# Patient Record
Sex: Male | Born: 1985 | Race: White | Hispanic: No | Marital: Single | Smoking: Current every day smoker
Health system: Southern US, Community
[De-identification: ages and names within clinical notes are randomized; demographics above are authoritative.]

## PROBLEM LIST (undated history)

## (undated) DIAGNOSIS — K5090 Crohn's disease, unspecified, without complications: Secondary | ICD-10-CM

## (undated) DIAGNOSIS — N200 Calculus of kidney: Secondary | ICD-10-CM

## (undated) HISTORY — PX: OTHER SURGICAL HISTORY: SHX169

## (undated) HISTORY — PX: CHOLECYSTECTOMY: SHX55

---

## 2005-11-05 ENCOUNTER — Emergency Department (HOSPITAL_COMMUNITY): Admission: EM | Admit: 2005-11-05 | Discharge: 2005-11-06 | Payer: Self-pay | Admitting: Emergency Medicine

## 2005-11-07 ENCOUNTER — Inpatient Hospital Stay (HOSPITAL_COMMUNITY): Admission: EM | Admit: 2005-11-07 | Discharge: 2005-11-13 | Payer: Self-pay | Admitting: Emergency Medicine

## 2005-11-08 ENCOUNTER — Ambulatory Visit: Payer: Self-pay | Admitting: Internal Medicine

## 2005-11-11 ENCOUNTER — Encounter (INDEPENDENT_AMBULATORY_CARE_PROVIDER_SITE_OTHER): Payer: Self-pay | Admitting: Specialist

## 2005-11-11 ENCOUNTER — Ambulatory Visit: Payer: Self-pay | Admitting: Internal Medicine

## 2006-04-15 ENCOUNTER — Ambulatory Visit: Payer: Self-pay | Admitting: Internal Medicine

## 2006-04-16 ENCOUNTER — Ambulatory Visit: Payer: Self-pay | Admitting: Internal Medicine

## 2006-04-30 ENCOUNTER — Encounter (HOSPITAL_COMMUNITY): Admission: RE | Admit: 2006-04-30 | Discharge: 2006-07-29 | Payer: Self-pay | Admitting: Internal Medicine

## 2006-05-11 ENCOUNTER — Ambulatory Visit: Payer: Self-pay | Admitting: Internal Medicine

## 2006-05-25 ENCOUNTER — Ambulatory Visit: Payer: Self-pay | Admitting: Internal Medicine

## 2006-06-01 ENCOUNTER — Ambulatory Visit: Payer: Self-pay | Admitting: Internal Medicine

## 2006-06-02 ENCOUNTER — Ambulatory Visit: Payer: Self-pay | Admitting: Internal Medicine

## 2006-06-22 ENCOUNTER — Ambulatory Visit: Payer: Self-pay | Admitting: Internal Medicine

## 2006-06-30 ENCOUNTER — Ambulatory Visit: Payer: Self-pay | Admitting: Internal Medicine

## 2006-12-23 ENCOUNTER — Encounter: Payer: Self-pay | Admitting: Internal Medicine

## 2007-01-28 ENCOUNTER — Ambulatory Visit: Payer: Self-pay | Admitting: Internal Medicine

## 2007-07-26 ENCOUNTER — Encounter: Payer: Self-pay | Admitting: Internal Medicine

## 2007-09-30 ENCOUNTER — Encounter: Payer: Self-pay | Admitting: Internal Medicine

## 2008-01-19 ENCOUNTER — Encounter: Payer: Self-pay | Admitting: Internal Medicine

## 2008-01-21 ENCOUNTER — Encounter: Payer: Self-pay | Admitting: Internal Medicine

## 2008-03-05 ENCOUNTER — Encounter: Payer: Self-pay | Admitting: Internal Medicine

## 2008-04-13 DIAGNOSIS — F32A Depression, unspecified: Secondary | ICD-10-CM | POA: Insufficient documentation

## 2008-04-13 DIAGNOSIS — F411 Generalized anxiety disorder: Secondary | ICD-10-CM | POA: Insufficient documentation

## 2008-04-13 DIAGNOSIS — K219 Gastro-esophageal reflux disease without esophagitis: Secondary | ICD-10-CM

## 2008-04-13 DIAGNOSIS — F329 Major depressive disorder, single episode, unspecified: Secondary | ICD-10-CM

## 2008-04-13 DIAGNOSIS — E669 Obesity, unspecified: Secondary | ICD-10-CM | POA: Insufficient documentation

## 2008-04-14 ENCOUNTER — Ambulatory Visit: Payer: Self-pay | Admitting: Internal Medicine

## 2008-04-17 LAB — CONVERTED CEMR LAB
ALT: 38 units/L (ref 0–53)
AST: 24 units/L (ref 0–37)
Albumin: 4 g/dL (ref 3.5–5.2)
Basophils Absolute: 0 10*3/uL (ref 0.0–0.1)
Calcium: 9.5 mg/dL (ref 8.4–10.5)
Chloride: 102 meq/L (ref 96–112)
Eosinophils Absolute: 0.5 10*3/uL (ref 0.0–0.7)
MCHC: 35.3 g/dL (ref 30.0–36.0)
MCV: 86.9 fL (ref 78.0–100.0)
Neutrophils Relative %: 68.8 % (ref 43.0–77.0)
Platelets: 275 10*3/uL (ref 150–400)
Potassium: 3.6 meq/L (ref 3.5–5.1)
RDW: 12.6 % (ref 11.5–14.6)

## 2008-04-19 ENCOUNTER — Encounter (HOSPITAL_COMMUNITY): Admission: RE | Admit: 2008-04-19 | Discharge: 2008-07-18 | Payer: Self-pay | Admitting: Internal Medicine

## 2008-04-20 ENCOUNTER — Telehealth: Payer: Self-pay | Admitting: Internal Medicine

## 2008-04-20 ENCOUNTER — Encounter: Payer: Self-pay | Admitting: Internal Medicine

## 2008-05-15 ENCOUNTER — Telehealth: Payer: Self-pay | Admitting: Internal Medicine

## 2008-06-01 ENCOUNTER — Telehealth: Payer: Self-pay | Admitting: Internal Medicine

## 2008-06-01 ENCOUNTER — Encounter: Payer: Self-pay | Admitting: Internal Medicine

## 2008-06-05 ENCOUNTER — Telehealth: Payer: Self-pay | Admitting: Internal Medicine

## 2008-06-05 DIAGNOSIS — R209 Unspecified disturbances of skin sensation: Secondary | ICD-10-CM

## 2008-06-08 ENCOUNTER — Telehealth: Payer: Self-pay | Admitting: Internal Medicine

## 2008-07-03 ENCOUNTER — Ambulatory Visit: Payer: Self-pay | Admitting: Internal Medicine

## 2008-07-03 DIAGNOSIS — G894 Chronic pain syndrome: Secondary | ICD-10-CM | POA: Insufficient documentation

## 2008-07-04 ENCOUNTER — Encounter: Payer: Self-pay | Admitting: Internal Medicine

## 2008-07-04 DIAGNOSIS — E538 Deficiency of other specified B group vitamins: Secondary | ICD-10-CM

## 2008-07-04 LAB — CONVERTED CEMR LAB
AST: 20 units/L (ref 0–37)
Albumin: 3.9 g/dL (ref 3.5–5.2)
Alkaline Phosphatase: 73 units/L (ref 39–117)
BUN: 12 mg/dL (ref 6–23)
CRP, High Sensitivity: 13 — ABNORMAL HIGH (ref 0.00–5.00)
Eosinophils Absolute: 0.5 10*3/uL (ref 0.0–0.7)
Eosinophils Relative: 4.5 % (ref 0.0–5.0)
GFR calc non Af Amer: 111 mL/min
HCT: 42.5 % (ref 39.0–52.0)
Hemoglobin: 15.1 g/dL (ref 13.0–17.0)
MCV: 85.6 fL (ref 78.0–100.0)
Monocytes Absolute: 0.6 10*3/uL (ref 0.1–1.0)
Neutro Abs: 7.4 10*3/uL (ref 1.4–7.7)
Platelets: 307 10*3/uL (ref 150–400)
Potassium: 3.7 meq/L (ref 3.5–5.1)
RDW: 13.1 % (ref 11.5–14.6)
TSH: 2.9 microintl units/mL (ref 0.35–5.50)
Total Bilirubin: 0.5 mg/dL (ref 0.3–1.2)
Vitamin B-12: 274 pg/mL (ref 211–911)
WBC: 10.6 10*3/uL — ABNORMAL HIGH (ref 4.5–10.5)

## 2008-07-05 ENCOUNTER — Ambulatory Visit: Payer: Self-pay | Admitting: Internal Medicine

## 2008-07-05 ENCOUNTER — Telehealth: Payer: Self-pay | Admitting: Internal Medicine

## 2008-07-07 ENCOUNTER — Telehealth: Payer: Self-pay | Admitting: Internal Medicine

## 2008-07-13 ENCOUNTER — Encounter: Payer: Self-pay | Admitting: Internal Medicine

## 2008-07-28 ENCOUNTER — Telehealth: Payer: Self-pay | Admitting: Internal Medicine

## 2008-07-31 ENCOUNTER — Telehealth: Payer: Self-pay | Admitting: Internal Medicine

## 2008-08-18 ENCOUNTER — Ambulatory Visit: Payer: Self-pay | Admitting: Internal Medicine

## 2008-08-23 ENCOUNTER — Encounter (HOSPITAL_COMMUNITY): Admission: RE | Admit: 2008-08-23 | Discharge: 2008-11-21 | Payer: Self-pay | Admitting: Internal Medicine

## 2008-08-24 ENCOUNTER — Encounter: Payer: Self-pay | Admitting: Internal Medicine

## 2008-08-31 ENCOUNTER — Encounter: Payer: Self-pay | Admitting: Internal Medicine

## 2008-08-31 ENCOUNTER — Ambulatory Visit (HOSPITAL_COMMUNITY): Admission: RE | Admit: 2008-08-31 | Discharge: 2008-08-31 | Payer: Self-pay | Admitting: Internal Medicine

## 2008-08-31 ENCOUNTER — Ambulatory Visit: Payer: Self-pay | Admitting: Internal Medicine

## 2008-09-01 ENCOUNTER — Encounter: Payer: Self-pay | Admitting: Internal Medicine

## 2008-09-11 ENCOUNTER — Telehealth: Payer: Self-pay | Admitting: Internal Medicine

## 2008-10-09 ENCOUNTER — Encounter (INDEPENDENT_AMBULATORY_CARE_PROVIDER_SITE_OTHER): Payer: Self-pay

## 2008-10-10 ENCOUNTER — Ambulatory Visit: Payer: Self-pay | Admitting: Internal Medicine

## 2008-10-10 DIAGNOSIS — K589 Irritable bowel syndrome without diarrhea: Secondary | ICD-10-CM | POA: Insufficient documentation

## 2008-10-23 ENCOUNTER — Telehealth: Payer: Self-pay | Admitting: Internal Medicine

## 2008-10-26 LAB — CONVERTED CEMR LAB
ALT: 25 units/L (ref 0–53)
Albumin: 3.8 g/dL (ref 3.5–5.2)
Basophils Relative: 0.6 % (ref 0.0–3.0)
CO2: 23 meq/L (ref 19–32)
Eosinophils Relative: 5.2 % — ABNORMAL HIGH (ref 0.0–5.0)
Folate: 4.3 ng/mL
GFR calc Af Amer: 154 mL/min
GFR calc non Af Amer: 127 mL/min
Glucose, Bld: 92 mg/dL (ref 70–99)
Lymphocytes Relative: 24.1 % (ref 12.0–46.0)
MCV: 86.8 fL (ref 78.0–100.0)
Monocytes Relative: 4.6 % (ref 3.0–12.0)
Neutrophils Relative %: 65.5 % (ref 43.0–77.0)
Platelets: 253 10*3/uL (ref 150–400)
Potassium: 4 meq/L (ref 3.5–5.1)
RBC: 5.11 M/uL (ref 4.22–5.81)
Sodium: 136 meq/L (ref 135–145)
Total Bilirubin: 0.6 mg/dL (ref 0.3–1.2)
Total Protein: 7.5 g/dL (ref 6.0–8.3)
WBC: 8 10*3/uL (ref 4.5–10.5)

## 2008-10-27 ENCOUNTER — Ambulatory Visit: Payer: Self-pay | Admitting: Internal Medicine

## 2008-10-29 ENCOUNTER — Emergency Department (HOSPITAL_COMMUNITY): Admission: EM | Admit: 2008-10-29 | Discharge: 2008-10-29 | Payer: Self-pay | Admitting: Emergency Medicine

## 2008-11-09 ENCOUNTER — Encounter: Payer: Self-pay | Admitting: Internal Medicine

## 2008-11-21 ENCOUNTER — Encounter: Payer: Self-pay | Admitting: Internal Medicine

## 2008-11-23 ENCOUNTER — Encounter: Payer: Self-pay | Admitting: Internal Medicine

## 2008-11-23 ENCOUNTER — Telehealth: Payer: Self-pay | Admitting: Internal Medicine

## 2008-12-08 ENCOUNTER — Telehealth: Payer: Self-pay | Admitting: Internal Medicine

## 2008-12-26 ENCOUNTER — Ambulatory Visit: Payer: Self-pay | Admitting: Internal Medicine

## 2009-01-01 ENCOUNTER — Encounter (HOSPITAL_COMMUNITY): Admission: RE | Admit: 2009-01-01 | Discharge: 2009-04-01 | Payer: Self-pay | Admitting: Internal Medicine

## 2009-01-02 ENCOUNTER — Encounter: Payer: Self-pay | Admitting: Internal Medicine

## 2009-01-10 ENCOUNTER — Telehealth: Payer: Self-pay | Admitting: Internal Medicine

## 2009-01-18 ENCOUNTER — Ambulatory Visit: Payer: Self-pay | Admitting: Internal Medicine

## 2009-01-22 ENCOUNTER — Telehealth: Payer: Self-pay | Admitting: Internal Medicine

## 2009-02-07 ENCOUNTER — Ambulatory Visit: Payer: Self-pay | Admitting: Internal Medicine

## 2009-02-13 ENCOUNTER — Encounter: Payer: Self-pay | Admitting: Internal Medicine

## 2009-02-20 ENCOUNTER — Telehealth: Payer: Self-pay | Admitting: Internal Medicine

## 2009-03-13 ENCOUNTER — Ambulatory Visit: Payer: Self-pay | Admitting: Internal Medicine

## 2009-03-16 LAB — CONVERTED CEMR LAB
ALT: 26 units/L (ref 0–53)
AST: 23 units/L (ref 0–37)
Alkaline Phosphatase: 88 units/L (ref 39–117)
BUN: 8 mg/dL (ref 6–23)
Basophils Absolute: 0.4 10*3/uL — ABNORMAL HIGH (ref 0.0–0.1)
Basophils Relative: 4.3 % — ABNORMAL HIGH (ref 0.0–3.0)
CRP, High Sensitivity: 16 — ABNORMAL HIGH (ref 0.00–5.00)
Creatinine, Ser: 0.8 mg/dL (ref 0.4–1.5)
Eosinophils Absolute: 0.3 10*3/uL (ref 0.0–0.7)
Hemoglobin: 15.5 g/dL (ref 13.0–17.0)
Lymphocytes Relative: 24.3 % (ref 12.0–46.0)
MCHC: 34.9 g/dL (ref 30.0–36.0)
MCV: 85.5 fL (ref 78.0–100.0)
Monocytes Absolute: 0.3 10*3/uL (ref 0.1–1.0)
Neutro Abs: 5.7 10*3/uL (ref 1.4–7.7)
Neutrophils Relative %: 64.2 % (ref 43.0–77.0)
RBC: 5.18 M/uL (ref 4.22–5.81)
RDW: 12.9 % (ref 11.5–14.6)

## 2009-03-27 ENCOUNTER — Ambulatory Visit: Payer: Self-pay | Admitting: Internal Medicine

## 2009-03-27 ENCOUNTER — Encounter: Payer: Self-pay | Admitting: Internal Medicine

## 2009-03-28 DIAGNOSIS — R195 Other fecal abnormalities: Secondary | ICD-10-CM

## 2009-03-28 LAB — CONVERTED CEMR LAB
OCCULT 1: POSITIVE
OCCULT 2: POSITIVE
OCCULT 3: NEGATIVE
OCCULT 4: NEGATIVE
OCCULT 5: NEGATIVE

## 2009-03-30 ENCOUNTER — Ambulatory Visit: Payer: Self-pay | Admitting: Internal Medicine

## 2009-04-02 ENCOUNTER — Telehealth: Payer: Self-pay | Admitting: Internal Medicine

## 2009-04-12 ENCOUNTER — Telehealth: Payer: Self-pay | Admitting: Internal Medicine

## 2009-04-18 ENCOUNTER — Telehealth (INDEPENDENT_AMBULATORY_CARE_PROVIDER_SITE_OTHER): Payer: Self-pay

## 2009-04-23 ENCOUNTER — Ambulatory Visit: Payer: Self-pay | Admitting: Internal Medicine

## 2009-04-30 ENCOUNTER — Ambulatory Visit: Payer: Self-pay | Admitting: Internal Medicine

## 2009-04-30 DIAGNOSIS — R1115 Cyclical vomiting syndrome unrelated to migraine: Secondary | ICD-10-CM

## 2009-05-07 ENCOUNTER — Encounter (HOSPITAL_COMMUNITY): Admission: RE | Admit: 2009-05-07 | Discharge: 2009-08-05 | Payer: Self-pay | Admitting: Internal Medicine

## 2009-05-08 ENCOUNTER — Encounter: Payer: Self-pay | Admitting: Internal Medicine

## 2009-05-11 ENCOUNTER — Telehealth: Payer: Self-pay | Admitting: Internal Medicine

## 2009-05-21 ENCOUNTER — Telehealth (INDEPENDENT_AMBULATORY_CARE_PROVIDER_SITE_OTHER): Payer: Self-pay

## 2009-05-22 ENCOUNTER — Encounter (INDEPENDENT_AMBULATORY_CARE_PROVIDER_SITE_OTHER): Payer: Self-pay

## 2009-06-11 ENCOUNTER — Telehealth: Payer: Self-pay | Admitting: Internal Medicine

## 2009-06-14 ENCOUNTER — Telehealth (INDEPENDENT_AMBULATORY_CARE_PROVIDER_SITE_OTHER): Payer: Self-pay

## 2009-06-19 ENCOUNTER — Encounter: Payer: Self-pay | Admitting: Internal Medicine

## 2009-06-21 ENCOUNTER — Telehealth: Payer: Self-pay | Admitting: Internal Medicine

## 2009-06-25 ENCOUNTER — Encounter: Payer: Self-pay | Admitting: Internal Medicine

## 2009-06-28 ENCOUNTER — Ambulatory Visit: Payer: Self-pay | Admitting: Internal Medicine

## 2009-07-18 ENCOUNTER — Telehealth: Payer: Self-pay | Admitting: Internal Medicine

## 2011-03-28 NOTE — Assessment & Plan Note (Signed)
[facility]                               PULMONARY OFFICE NOTE   [name] Mercado, [name] Mercado                          MRN:          [id number]  DATE:06/01/2006                            DOB:          03-15-1986    PROBLEMS:  This is a 25 year old college study, self referred for allergy  evaluation.   HISTORY OF PRESENT ILLNESS:  He is going to college in [place], but his family  now lives in [place], so he comes down each summer to live here from mid  May through beginning of school in August and also again at Christmas.  He  seeks allergy evaluation while here.  Otherwise, he has been followed for  medical care by the [facility] [place] by Dr. Darrick Penna.  He describes a long  history of asthma since childhood with seasonal allergies and particular  aggravation by exposure to cat.  He had allergy skin testing at age 59  without allergy vaccine.  He was told then that he was broadly positive to  most agents tested.  Asthma has been aggravated particularly by exercise and  by seasonal allergies.  He has never required hospitalization for these  problems, and currently, he is using albuterol about b.i.d.  He notices  puffiness and redness of his eyes, nasal congestion with nasal discharge,  wheezing and chest tightness.  Asthma may be partly exertional, but the eye  and nose symptoms are more clearly associated with pollens.  His parents now  living in [place] have a cat, and when he visits there, he has increased  itching and sneezing as well.   MEDICATIONS:  1.  Azathioprine 150 mg .  2.  Asacol 2.4 g.  3.  Lexapro 10 mg.  4.  Folic acid 1 mg.  5.  Iron 621 mg.  6.  Nexium 40 mg.  7.  Remicade 5 mg/kg IV q.8 weeks.  8.  Albuterol inhaler.   ALLERGIES:  DRUG INTOLERANCE TO PREVACID CAUSING CHEST PAIN.   REVIEW OF SYSTEMS:  Shortness of breath with activity, productive cough,  acid indigestion, weight gain, nasal congestion, sneezing and itching,  occasional rash.  Fleas and mosquitos cause large local reactions.  He has  experienced hives around dogs, but this has been rare.  Chronic recurrent  insomnia aggravated by his work schedule as he tries to work two jobs at  different shifts during the summer.   PAST MEDICAL HISTORY:  1.  Asthma.  2.  Eczema.  3.  Allergic rhinitis.  4.  No Ear, Nose and Throat surgery.  5.  Many upper respiratory infections.  6.  No problems with his ears.  7.  No history of pneumonia or tuberculosis exposure.  8.  No cardiac disease.  9.  No diabetes.  10. No cancer.  11. Primary medical problem has been Crohn's disease, but he also has      esophageal reflux.  12. No history of medication intolerance to Latex, contrast dye or aspirin,      except that he is told not to take aspirin  because of his Crohn's.  13. He would like to minimize prednisone which he has had quite a bit of in      the past.   SOCIAL HISTORY:  Smoking one half pack per day, alcohol once or twice a  month.  Unmarried, living with parents.  He is a Health and safety inspector at [employer].  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   FAMILY HISTORY:  Father and grandfather with diabetes.  Nobody known to have  allergy problems.   OBJECTIVE:  VITAL SIGNS:  Weight 243 pounds, blood pressure 116/80, pulse  76, room air saturation 98%.  GENERAL APPEARANCE:  This is an obese young man in no acute distress.  HEENT:  Conjunctivae are clear.  There is moderate nasal mucous bilaterally.  Pharynx is clear.  HEART:  Sounds are regular without murmur or gallop.  LUNGS:  Clear to P&A.  He has acne but no other rash.  No edema, cyanosis or  clubbing.   IMPRESSION:  1.  Allergic rhinitis.  2.  Allergic conjunctivitis.  3.  Asthma with significant allergic components.  4.  Insomnia.  5.  Disruptive sleep schedule.  6.  Crohn's disease on immunosuppressive  therapy.   PLAN:  1.  Smoking cessation was heavily emphasized.  2.  Sample Optivar one drop each eye b.i.d. p.r.n.  3.  Sample Singulair 10 mg daily p.r.n.  Call for prescriptions if helpful.  4.  Continue with Albuterol as a rescue inhaler.  5.  Ambien 10 mg q.h.s. p.r.n., occasional use as discussed.  6.  Schedule return in one month, earlier p.r.n.                                   Clinton D. Maple Hudson, MD, FCCP, FACP   CDY/MedQ  DD:  06/01/2006  DT:  06/02/2006  Job #:  [id number]

## 2011-03-28 NOTE — Consult Note (Signed)
NAMEMIDAS, DAUGHETY NO.:  [id number]   MEDICAL RECORD NO.:  [id number]          PATIENT TYPE:  INP   LOCATION:  1606                         FACILITY:  [facility]   PHYSICIAN:  Iva Boop, M.D. LHCDATE OF BIRTH:  [birth year]   DATE OF CONSULTATION:  11/08/2005  DATE OF DISCHARGE:                                   CONSULTATION   REQUESTING PHYSICIAN:  Hettie Holstein, D.O., [facility].   REASON FOR CONSULTATION:  Vomiting, chest pain, inflammatory bowel disease.   ASSESSMENT:  This is a 25 year old white male who has inflammatory bowel  disease.  A colonoscopy in [place], [place] demonstrated pan colitis.  There were some granulomas on the biopsies as well as an inflammatory  infiltrate and crypt abscesses.  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  I wonder if he does not have Crohn's  disease.  He is currently improved on Asacol with respect to his colitis,  although he still has tenesmus and mucus production.  He is describing post  prandial chest and epigastric pain that requires narcotics.  If he does not  have his narcotics, he will vomit.  These symptoms have really been a  problem for a year or more.  He has lost about 65 pounds in a year.  Mild  anemia, probably chronic disease, +/- iron deficiency.   RECOMMENDATIONS/PLAN:  1.  Institute Solu-Medrol 40 mg IV q.12h.  2.  Consider small bowel follow-through versus CT scanning, depending upon      his response, and he may need an EGD again.  3.  I do not really think he has an infection.  That was partially worked up      in [place].  They were considering steroids pending those results, but I      think at this point, we ought to go ahead and initiate steroids.   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  He is studying psychology  and is hoping to go to medical school.  About a year ago, he developed  problems with watery diarrhea, frequent.  Eventually, he came to the  attention of a gastroenterologist, Dr. Marygrace Drought, M.D., Ph.D, in [place], [place].  He had complained of intermittent hematochezia, as well, and  heartburn problems.  The EGD demonstrated (August 07, 2005) erosive  distal esophagitis with irregular erosions at the level of the  squamocolumnar junction at 41 cm from the incisors, and distal esophageal  biopsies were obtained.  The biopsies showed a chronic carditis.  He had a  sliding hiatal hernia as well.  Proximal small bowel biopsies were normal.  The biopsies labeled distal esophagus showed cardiac mucosa with dense mixed  inflammatory cell infiltrate composed of plasma cells, lymphocytes,  eosinophils, and neutrophils with inflammation of scattered glands.  A  colonoscopy to the cecum showed patchy erythema, erosions, suspicious  ulcerations, and mucosal edema from the colon to the rectum.  There were  membranes and white fibrin-like changes not typical for classic  pseudomembranous colitis.  A colonic aspirate was obtained to check for C.  diff toxin, and I think that was negative.  He was started on Asacol 1200 mg  t.i.d. and did improve to the point where he does not have so much diarrhea,  but he does have some tenesmus and urge to defecate at times.  The biopsies  from the colon showed moderate-to-marked mixed inflammatory cell infiltrate  of plasma cells, lymphocytes, neutrophils, and eosinophils with cryptitis  and crypt abscess formation.  In one of the specimens, two tiny granulomas  were noted.  There were no dysplasias.  Other studies show a stool culture  from October 27, 2005 with no enteric pathogens.  He had moderate yeast on  an ova and parasite screen but no ova and parasites.  A CBC in October  showed a hemoglobin of 12.7,  white count 10.4, platelet count 422.  His CMET  was normal except for an albumin at 3.3.  A rheumatoid factor was negative,  and ANA was negative.  Sed rate was 17.  This is all in October.  He was  having this chest pain and vomiting problems, and he called his doctor in  [place].  They recommended that he come to the hospital.  Since here, he has  been treated with a clear-liquid diet, promethazine, and Dilaudid and feels  somewhat better, although still has persistent problems.   PAST MEDICAL HISTORY:  Notable for reflux diagnosed at age 22, although it  sounds like he had it longer than that.  He had eczema and exercise-induced  asthma.  He had a cholecystectomy for gallstones this fall, but that did not  really seem to help him.  Anxiety/depression.   DRUG ALLERGIES:  PREVACID has caused nausea and chest discomfort.   SOCIAL HISTORY:  He is a smoker.  Uses some alcohol.  He tried to quit  smoking and thinks his colitis worsened.   HOME MEDICATIONS:  Reglan, Nexium, Lexapro, Vicodin, and Asacol.   FAMILY HISTORY:  There is a relative of his grandmother who has Crohn's  disease, and his mother indicates there are a lot of gastrointestinal  disorders such as irritable bowel syndrome, but we do not have definitive  diagnoses in the family.   REVIEW OF SYSTEMS:  Positive for seasonal allergies.  He has described some  joint pain, weakness, nausea, as described above.  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  He occasionally has some problems with warts, and he has some  eczema problems.  His sleep has been disturbed by this.   PHYSICAL EXAMINATION:  VITAL SIGNS:  Temperature 98.5, pulse 65,  respirations 20, blood pressure 123/77.  GENERAL:  A pleasant, well-developed young white male in no acute distress.  He looks mildly ill.  HEENT:  Eyes anicteric.  Mouth:  Posterior pharynx free of lesions.  NECK:  Supple without mass, thyromegaly.  LUNGS:  Clear. HEART:  S1 and S2.  No murmurs,  rubs or gallops.  ABDOMEN:  Soft.  He has some mild-to-moderate left lower quadrant tenderness  without organomegaly or mass.  There is no hepatosplenomegaly, as noted.  RECTAL:  Inspection of the rectal area shows no perianal changes.  Digital  exam is deferred.  EXTREMITIES:  No edema.  SKIN:  Slightly pale without rash.  LYMPH NODES:  No neck or supraclavicular  nodes.  NEUROLOGIC:  He is alert and oriented x3.   Lab data shows CBC with white count of 15, hemoglobin 12, hematocrit 37, MCV  72.  BMET:  Glucose 100, BUN 4.  Urinalysis is negative except for greater  than 80 ketones.  Amylase normal.  Lipase normal at 45 and 24, respectively.  CMET shows bilirubin of 1.5, but otherwise normal LFTs.  When he presented,  his potassium was slightly low at 3.1.  That was the second lab.   In the hospital, he is on a nicotine patch, IV Protonix, Dilaudid,  metoclopramide, and his Lexapro.   I appreciate the opportunity to care for this patient.      Iva Boop, M.D. [facility]  Electronically Signed     CEG/MEDQ  D:  11/08/2005  T:  11/09/2005  Job:  [id number]   cc:   Hettie Holstein, D.O.   [facility]  [address]  [place], [place] [address]

## 2011-03-28 NOTE — Assessment & Plan Note (Signed)
[name]                         GASTROENTEROLOGY OFFICE NOTE   [name] Mercado                          MRN:          [id number]  DATE:01/28/2007                            DOB:          1986/06/03    [name] Mercado is home on break.  He is preparing for the MCATs.  He has been  cared for in [place], and has now had to have Remicade every 6 weeks  because of recurrent symptoms.  His Crohn's disease is now apparently  involving the esophagus and stomach, and he has upper abdominal pain  that has been treated with Percocet.  However, he is out of his  Percocet.  He is seeing me to request pain medication for this problem,  stating that the doctors in [place] cannot prescribe it because he is not  there, and it cannot be called in.  His other medications are listed and  reviewed in the chart.  He remains on a proton pump inhibitor, using  Protonix.  The Remicade dose is at 5 mg per kg, though they are  contemplating increasing that.  He denies fevers.  He is having some  allergy problems with his mother's cat since he is home.  He has gained  23 pounds since he was last seen in August.   PHYSICAL EXAMINATION:  Weight 273 pounds.  Pulse 88.  Blood pressure  110/72.  CHEST WALL:  Non-tender in the sternum, but he does have some mild  tenderness over the left and right lower ribcage.  ABDOMEN:  Soft with some mild epigastric tenderness.  Stria present  again.   ASSESSMENT:  Crohn's disease of the duodenum, as well as now,  apparently, the esophagus and the stomach.  I had diagnosed him with  Crohn's disease in the duodenum previously.  He also has Crohn's  colitis.  He has pain problems related to this.   PLAN:  1. Percocet 5/325, one to two every 4 to 6 hours as needed, number 60      with no refills was prescribed.  2. He plans to return to [place] in several weeks.  We will see him back      in the summer to carry through with his Remicade and continue his   care here when he returns.  He may be on different therapy, I am      not sure.  It certainly sounds like increasing the dose of Remicade      is the next reasonable option.  Checking his metabolites on his      azathioprine.     [name] Boop, MD,FACG  Electronically Signed    CEG/MedQ  DD: 01/28/2007  DT: 01/28/2007  Job #: [id number]

## 2011-03-28 NOTE — Assessment & Plan Note (Signed)
[facility]                           GASTROENTEROLOGY OFFICE NOTE   NAME:[name], Mercado                          MRN:          [id number]  DATE:06/02/2006                            DOB:          1986/10/22    CHIEF COMPLAINT:  Follow up of Crohn's ileocolitis and chronic  immunosuppressive therapy.   Mercado Mercado is doing well with minor diarrhea.  There is no bleeding.  No fever or  chills or significant abdominal pain.  His Nexium is controlling his  heartburn.  He goes back to school on September 2.  His next Remicade is on  August 16.  Based upon TPMT and 6TG levels not measurable on the and  138 on the 6TG level, I increased his Azathioprine from 100 mg to 150 mg  daily.  So far he has tolerated that well with normal CBC and hepatic  function panel on July 2 and May 25, 2006.  He continues to smoke.  He says  he is stressed and it is hard to quit.  He has quit before but he mainly  smokes as an oral fixation which he relates to obsessive compulsive issues.  I emphasized the importance of quitting smoking for general health reasons  plus that it aggravates Crohn's disease.  He is going to work on that.  Perhaps when he gets back to school, he can get more long term follow-up for  that.  His weight has increased about 9 pounds from 237 to 246.  He is off  prednisone at this point. He has been eating more under stress, he tells me.  Next Remicade is due August 16.   MEDICATIONS:  Remicade 5 mg/kg every eight weeks, Azathioprine 150 mg daily,  Lexapro 10 mg, Nexium 40 mg daily, ferrous sulfate 325 mg daily (normal CBC  and hemoglobin and MCV he can stop), folic acid 1 mg daily, Asacol 800 mg  three times daily, unknown allergy medication, Ambien, Tylenol, Benadryl  p.r.n.   ALLERGIES:  PREVACID HAS CAUSED CHEST PAIN.   PAST MEDICAL HISTORY:  Reviewed and unchanged from my note of April 15, 2006.   REVIEW OF SYSTEMS:  As described above regarding  constitutional and GI.   PHYSICAL EXAMINATION:  GENERAL:  Obese, pleasant young man in no acute  distress.  HEENT:  Eyes anicteric.  Mouth, posterior pharynx, lips, teeth, gums normal.  CHEST:  Clear.  HEART:  S1, S2, no murmurs or gallops.  ABDOMEN:  Obese, soft, stria present.  There is no organomegaly or mass  detected.  Nontender.  SKIN:  He has acne on the face.  PSYCH:  He is alert and oriented x3.  OTHER NOTES:  Nipple rings in place bilaterally.   LABORATORY DATA:  As above.   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  He continues to smoke and is counseled to quit again.  He is gaining  some weight. He is advised to watch this as well.   RECOMMENDATIONS AND PLAN:  1.  Recheck CBC a month from his last one as well  as hepatic function panel      to follow up on his azathioprine therapy.  2.  Remicade infusion is set for August 16.  3.  He will return to school, [employer] in September.  He will      see me as needed in the interim.  We will leave it to them to adjust his      medication further and consider 6TG and 6 MMPN levels when he is there.      He is clinically well at this point. I  have questioned whether or not      he needs to remain on Asacol and explained to him that some people leave      people on all of these medications, i.e. Remicade, azathioprine and      Asacol versus dropping back to Asacol.  I also again emphasized that      quitting smoking could potentially help his Crohn's significantly and      perhaps reduce his need for immunosuppressive therapy and its possible      side effects of lymphoma, leukemia and infections.                                   Iva Boop, MD, [facility]   CEG/MedQ  DD:  06/02/2006  DT:  06/02/2006  Job #:  [id number]   cc:   Fredonia Highland, MD

## 2011-03-28 NOTE — Assessment & Plan Note (Signed)
[facility]                               PULMONARY OFFICE NOTE   NAME:HOUCKEyal, Greenhaw                          MRN:          [id number]  DATE:06/30/2006                            DOB:          July 03, 1986    PROBLEMS:  1. Allergic rhinitis.  2. Allergic conjunctivitis.  3. Allergic asthma.  4. Insomnia.  5. Irregular sleep schedule.  6. Crohn's disease, on immunosuppressive therapy.   HISTORY:  He reports Singulair and Optivar have proven to be a big  symptomatic help sufficient for now.  He is about to return to college.  He  is not experiencing significant itching, sneezing, or wheezing at the  present time, late summer.  [place] may experience a different allergy environmental  exposure.   MEDICATIONS:  1. Remicade 5 mg per kg every eight weeks.  2. Lexapro 10 mg.  3. Nexium 40 mg.  4. Ferrous sulfate 325 mg.  5. Folic acid.  6. Asacol 400 mg times two t.i.d.  7. Azathioprine 150 mg.  8. Singulair 10 mg.  9. Benadryl p.r.n.  10.Albuterol inhaler p.r.n.   Drug intolerant to PREVACID, which caused chest pain, heartburn, and rash.   PHYSICAL EXAMINATION:  Weight:  250 pounds.  BP:  100/60.  Pulse:  Regular,  86.  Room air saturation 97%.  Mild odor of tobacco reflecting ongoing  smoking.  Quiet, clear chest.  Conjunctivae not injected.  Nasal mucosa  clear.  Heart sounds regular without murmur.   IMPRESSION:  1. Allergic rhinitis and allergic asthma are under fair control currently      but may not be environmentally stressed yet.  2. Tobacco use is of particular concern.   PLAN:  1. Strong emphasis on smoking cessation.  2. We discussed and prescribed Chantix.  3. Schedule return p.r.n. as he returns next year at the end of school or      p.r.n.                                   Clinton D. Maple Hudson, MD, FCCP, FACP   CDY/MedQ  DD:  07/01/2006  DT:  07/02/2006  Job  #:  [id number]

## 2011-03-28 NOTE — Discharge Summary (Signed)
NAMERUEBEN, KASSIM NO.:  [id number]   MEDICAL RECORD NO.:  [id number]          PATIENT TYPE:  INP   LOCATION:  1606                         FACILITY:  [facility]   PHYSICIAN:  Hettie Holstein, D.O.    DATE OF BIRTH:  [birth year]   DATE OF ADMISSION:  11/07/2005  DATE OF DISCHARGE:  11/13/2005                                 DISCHARGE SUMMARY   GASTROENTEROLOGIST PRIMARY:  Dr. Joneen Caraway   ADMISSION DIAGNOSIS:  Abdominal pain.   DIAGNOSES AT TIME OF DISCHARGE:  1.  Crohn's ileitis status post gastroenterologist consult and evaluation by      Dr. Leone Payor, status post upper endoscopy and clinically suspected      Crohn's disease with some nodularity at the D1-D2 junction and a small      hiatal hernia. There were some biopsies but the results were not      available at the time of discharge.  2.  Asymptomatic bradycardia with hemodynamic stability throughout his      hospital course.  3.  Iron deficiency anemia, initiation of iron replacement during this      hospital course.   MEDICATIONS ON DISCHARGE:  1.  Prednisone taper at 60 mg until follow-up with Dr. Joneen Caraway on November 19, 2005.  2.  Asacol 400 mg four tablets t.i.d. as before.  3.  Nexium 40 mg twice daily.  4.  Iron sulfate 325 mg p.o. b.i.d.  5.  Lexapro 10 mg daily.  6.  Oxycodone 5 mg q.4h. as needed for pain, dispense #50.   DISPOSITION:  The patient is medically stable for transfer discharge to the  care of Dr. Joneen Caraway with an appointment on November 19, 2005, at 1:30 p.m.   STUDIES PERFORMED THIS ADMISSION:  As noted above, upper endoscopy with  biopsies as well as a CT scan with findings that were revealing of a mild  inflammatory process involving the right lower quadrant. This was  interpreted by our gastroenterologist to possible be related to Crohn's  ileitis. In addition, there was heterogenous attenuation within the liver  that could be related focal steatosis. It can be followed by his  gastroenterologist and if clinically suspicious for other liver disease, an  MRI may be more useful. There was a small amount of free fluid in the pelvis  on that scan as well.   HISTORY OF PRESENTING ILLNESS:  For full details please refer to the H&P as  dictated by Dr. Lonia Blood; however, Mr. Molina is 25 year old man with a 1-  year history of recurring abdominal pain, nausea, vomiting. At times he had  episodes of diarrhea with episodes of constipation. He had been evaluated by  gastroenterologist in [place] and was told that he had ulcerative colitis. He  was started on Asacol and the patient states that he was told he did not  have Crohn's. Reports were sent. The patient was admitted for management.   HOSPITAL COURSE:  The patient was admitted and administered initially IV  Solu-Medrol with very slow response, though he eventually did. Most  of the  symptoms were managed with narcotic analgesia with very little relief with  antiemetic therapy including Zofran and Phenergan. Dr. Leone Payor of  gastroenterology performed endoscopy and coordinated his diagnostic workup  here, and he is being referred back to his gastroenterologist for further  management of Crohn's disease and follow-up of pathology results as taken by  Dr. Leone Payor here. He is in medically stable condition for discharge.   OTHER ADDITIONAL STUDIES:  His EKG revealed bradycardia with a heart rate in  the 40s to 50s. He remained hemodynamically stable otherwise. His folate was  within normal limits, vitamin B12 was 685, ferritin was 46, iron saturation  was 4%. HIV antibody was nonreactive. CBC revealed WBC of 14.6, hemoglobin  11, platelet count 342. Basic metabolic panel revealed sodium 139, potassium  4, BUN 6, creatinine 0.7.      Hettie Holstein, D.O.  Electronically Signed     ESS/MEDQ  D:  11/13/2005  T:  11/13/2005  Job:  [id number]   cc:   Dr. Joneen Caraway  Append consult by Dr. Leone Payor as well as  H&P and send to  Dr.Misra as well.  FAX to [phone], ATTN:  Dr. Franki Cabot, M.D. [facility]  [address] [place], [place] [address]

## 2011-03-28 NOTE — H&P (Signed)
NAMESANDER, REMEDIOS NO.:  [id number]   MEDICAL RECORD NO.:  [id number]          PATIENT TYPE:  EMS   LOCATION:  ED                           FACILITY:  [facility]   PHYSICIAN:  Lonia Blood, M.D.       DATE OF BIRTH:  Mar 14, 1986   DATE OF ADMISSION:  11/07/2005  DATE OF DISCHARGE:                                HISTORY & PHYSICAL   The patient is an unassigned.   CHIEF COMPLAINT:  Abdominal pain.   HISTORY OF PRESENT ILLNESS:  Mr. Soward is a 25 year old man with a 1 year  history of recurrent abdominal pain, nausea and vomiting. At times, he also  has episodes of diarrhea alternating with episodes of constipation. He has  been evaluated by a gastroenterologist in [place] and was told that he has  esophagitis colitis and he was started on Asacol. The patient was told that  he does not have Crohn disease. This time he reports a 1 week history of  severe epigastric pain to the point where every time he eats he develops  severe abdominal pain and he promptly vomits everything.   PAST MEDICAL HISTORY:  Significant for mild exercise induced asthma, hiatal  hernia, esophageal reflux, ulcerative colitis, status post cholecystectomy  November 2006.   FAMILY HISTORY:  Positive for gastrointestinal disorders of unspecified  cause in mother, sister, cousin.   SOCIAL HISTORY:  The patient smokes cigarettes, he drinks occasional  alcohol. 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   ALLERGIES:  PREVACID.   MEDICATIONS:  Reglan, Nexium, Lexapro, Vicodin and Asacol.   REVIEW OF SYSTEMS:  Positive for seasonal allergies. Mr. Dowding reports being  allergic to everything and positive for occasional diarrhea alternating with  constipation.   PHYSICAL EXAMINATION ON ADMISSION:  VITAL SIGNS:  Temperature 97.9, pulse  104, respirations 16, blood pressure 150/89, saturation of 100% on room air.  GENERAL:  Mr. Mcquiston appears well-developed, well-nourished in no acute  distress. Alert and  oriented to person, place and time.  HEENT:  Head is normocephalic, atraumatic. His eyes are equal, round and  reactive to light and accommodation. Extraocular movements intact. Sclera  anicteric. Conjunctiva are pale. His mouth is without ulceration. Throat is  clear.  NECK:  Supple without JVD, no carotid bruits.  CHEST:  Clear to auscultation bilaterally without wheezes, rhonchi or  crackles.  HEART:  Regular rate and rhythm without murmurs, rubs or gallops.  ABDOMEN:  Really soft. Bowel sounds are decreased but present. There is  minimal tenderness of very deep palpation of the epigastrium otherwise the  abdomen is without any tenderness. There is no palpable hepatosplenomegaly.  GENITOURINARY:  There is no CVA tenderness.  EXTREMITIES:  No edema.  MUSCLE:  He has got intact muscle bulk and tone.  SKIN:  Very pale and dry.  NEUROLOGIC:  Cranial nerves III-XII are intact. Strength 5/5 in all 4  extremities. Sensation is intact.  PSYCHIATRIC:  The patient appears to have intact mood, insight, judgment and  memory.   LABORATORY DATA ON ADMISSION:  White blood cell count is  12,000, hemoglobin  13, hematocrit 41.3, platelet count 395, sodium 139, potassium 3.1, chloride  107, bicarb 18. BUN 5, creatinine 1, albumin 3.6, total bilirubin 1.5,  lipase 24. Urinalysis positive for ketones.   ASSESSMENT/PLAN:  1.  Abdominal pain with nausea and vomiting, very obscure cause. At this      point, my plan is to talk to Dr. Dayton Bailiff, who is the patient's      gastroenterologist from [place], and ask him about the previous studies,      since I do not see the value of repeating an endoscopy two months after      he just had one. At this point, we probably ought to consider some      exotic causes as eosinophilic esophagitis versus eosinophilic gastritis      versus some unrecognized form of H. pylori induced gastritis. I doubt      Mr. Chevez has a duodenal or gastric ulcer at this point as he has  been      having abdominal pian for about a week and his hemoglobin is within      normal limits. The plan is to admit Mr. Strange for observation, try to      treat symptomatically his abdominal pain, nausea and vomiting and see if      he is able to tolerate any oral diet.  2.  Metabolic acidosis with mild anion gap. Given the history and the      positive urine ketones, I do suspect this is starvation ketosis. Will      aggressively hydrate with intravenous fluids and followup basometabolic      profile.  3.  Hypokalemia secondary to the nausea and vomiting. Will replete the      potassium.      Lonia Blood, M.D.  Electronically Signed     SL/MEDQ  D:  11/07/2005  T:  11/07/2005  Job:  [id number]

## 2011-08-15 LAB — COMPREHENSIVE METABOLIC PANEL
AST: 29 U/L (ref 0–37)
Albumin: 3.7 g/dL (ref 3.5–5.2)
CO2: 22 mEq/L (ref 19–32)
Calcium: 9.2 mg/dL (ref 8.4–10.5)
Creatinine, Ser: 0.88 mg/dL (ref 0.4–1.5)
GFR calc Af Amer: >60 mL/min (ref >60)
GFR calc non Af Amer: >60 mL/min (ref >60)
Total Protein: 7 g/dL (ref 6.0–8.3)

## 2011-08-15 LAB — POCT CARDIAC MARKERS
CKMB, poc: <1 ng/mL — ABNORMAL LOW (ref 1.0–8.0)
Troponin i, poc: <0.05 ng/mL (ref 0.00–0.09)

## 2011-08-15 LAB — CBC
MCHC: 34.1 g/dL (ref 30.0–36.0)
MCV: 87.4 fL (ref 78.0–100.0)
Platelets: 232 10*3/uL (ref 150–400)
RDW: 13.4 % (ref 11.5–15.5)

## 2011-08-15 LAB — DIFFERENTIAL
Eosinophils Relative: 2 % (ref 0–5)
Lymphocytes Relative: 8 % — ABNORMAL LOW (ref 12–46)
Lymphs Abs: 0.7 10*3/uL (ref 0.7–4.0)
Monocytes Relative: 8 % (ref 3–12)

## 2011-08-15 LAB — LIPASE, BLOOD: Lipase: 17 U/L (ref 11–59)

## 2015-11-05 ENCOUNTER — Inpatient Hospital Stay (HOSPITAL_COMMUNITY)
Admission: EM | Admit: 2015-11-05 | Discharge: 2015-11-08 | DRG: 386 | Disposition: A | Discharge status: Home / Self Care | Payer: Self-pay | Attending: Internal Medicine | Admitting: Internal Medicine

## 2015-11-05 ENCOUNTER — Encounter (HOSPITAL_COMMUNITY): Payer: Self-pay | Admitting: Emergency Medicine

## 2015-11-05 DIAGNOSIS — K50118 Crohn's disease of large intestine with other complication: Principal | ICD-10-CM | POA: Diagnosis present

## 2015-11-05 DIAGNOSIS — K5090 Crohn's disease, unspecified, without complications: Secondary | ICD-10-CM | POA: Diagnosis present

## 2015-11-05 DIAGNOSIS — R1110 Vomiting, unspecified: Secondary | ICD-10-CM | POA: Insufficient documentation

## 2015-11-05 DIAGNOSIS — R109 Unspecified abdominal pain: Secondary | ICD-10-CM | POA: Diagnosis present

## 2015-11-05 DIAGNOSIS — Z79899 Other long term (current) drug therapy: Secondary | ICD-10-CM

## 2015-11-05 DIAGNOSIS — F329 Major depressive disorder, single episode, unspecified: Secondary | ICD-10-CM | POA: Diagnosis present

## 2015-11-05 DIAGNOSIS — Z79891 Long term (current) use of opiate analgesic: Secondary | ICD-10-CM

## 2015-11-05 DIAGNOSIS — F17210 Nicotine dependence, cigarettes, uncomplicated: Secondary | ICD-10-CM | POA: Diagnosis present

## 2015-11-05 DIAGNOSIS — K50119 Crohn's disease of large intestine with unspecified complications: Secondary | ICD-10-CM

## 2015-11-05 DIAGNOSIS — R1115 Cyclical vomiting syndrome unrelated to migraine: Secondary | ICD-10-CM | POA: Diagnosis present

## 2015-11-05 DIAGNOSIS — F32A Depression, unspecified: Secondary | ICD-10-CM | POA: Diagnosis present

## 2015-11-05 DIAGNOSIS — F419 Anxiety disorder, unspecified: Secondary | ICD-10-CM | POA: Diagnosis present

## 2015-11-05 DIAGNOSIS — Z9049 Acquired absence of other specified parts of digestive tract: Secondary | ICD-10-CM

## 2015-11-05 DIAGNOSIS — G894 Chronic pain syndrome: Secondary | ICD-10-CM | POA: Diagnosis present

## 2015-11-05 DIAGNOSIS — R52 Pain, unspecified: Secondary | ICD-10-CM

## 2015-11-05 DIAGNOSIS — K5010 Crohn's disease of large intestine without complications: Secondary | ICD-10-CM | POA: Insufficient documentation

## 2015-11-05 DIAGNOSIS — R112 Nausea with vomiting, unspecified: Secondary | ICD-10-CM

## 2015-11-05 DIAGNOSIS — G629 Polyneuropathy, unspecified: Secondary | ICD-10-CM | POA: Diagnosis present

## 2015-11-05 DIAGNOSIS — K50919 Crohn's disease, unspecified, with unspecified complications: Secondary | ICD-10-CM

## 2015-11-05 DIAGNOSIS — Z888 Allergy status to other drugs, medicaments and biological substances status: Secondary | ICD-10-CM

## 2015-11-05 DIAGNOSIS — Z6841 Body Mass Index (BMI) 40.0 and over, adult: Secondary | ICD-10-CM

## 2015-11-05 DIAGNOSIS — K219 Gastro-esophageal reflux disease without esophagitis: Secondary | ICD-10-CM | POA: Diagnosis present

## 2015-11-05 HISTORY — DX: Crohn's disease, unspecified, without complications: K50.90

## 2015-11-05 LAB — URINALYSIS, ROUTINE W REFLEX MICROSCOPIC
Bilirubin Urine: NEGATIVE
Glucose, UA: NEGATIVE mg/dL
Hgb urine dipstick: NEGATIVE
Ketones, ur: NEGATIVE mg/dL
LEUKOCYTES UA: NEGATIVE
NITRITE: NEGATIVE
PH: 8.5 — AB (ref 5.0–8.0)
Protein, ur: NEGATIVE mg/dL
SPECIFIC GRAVITY, URINE: 1.013 (ref 1.005–1.030)

## 2015-11-05 LAB — COMPREHENSIVE METABOLIC PANEL
ALBUMIN: 4.3 g/dL (ref 3.5–5.0)
ALK PHOS: 79 U/L (ref 38–126)
ALT: 55 U/L (ref 17–63)
AST: 46 U/L — ABNORMAL HIGH (ref 15–41)
Anion gap: 12 (ref 5–15)
BUN: 11 mg/dL (ref 6–20)
CO2: 24 mmol/L (ref 22–32)
CREATININE: 0.83 mg/dL (ref 0.61–1.24)
Calcium: 10.7 mg/dL — ABNORMAL HIGH (ref 8.9–10.3)
Chloride: 104 mmol/L (ref 101–111)
GFR calc Af Amer: >60 mL/min (ref >60)
GFR calc non Af Amer: >60 mL/min (ref >60)
GLUCOSE: 126 mg/dL — AB (ref 65–99)
Potassium: 3.8 mmol/L (ref 3.5–5.1)
SODIUM: 140 mmol/L (ref 135–145)
TOTAL PROTEIN: 8.5 g/dL — AB (ref 6.5–8.1)
Total Bilirubin: <0.1 mg/dL — ABNORMAL LOW (ref 0.3–1.2)

## 2015-11-05 LAB — CBC
HEMATOCRIT: 48.8 % (ref 39.0–52.0)
HEMOGLOBIN: 16.1 g/dL (ref 13.0–17.0)
MCH: 28.1 pg (ref 26.0–34.0)
MCHC: 33 g/dL (ref 30.0–36.0)
MCV: 85.3 fL (ref 78.0–100.0)
Platelets: 293 10*3/uL (ref 150–400)
RBC: 5.72 MIL/uL (ref 4.22–5.81)
RDW: 13.9 % (ref 11.5–15.5)
WBC: 13.4 10*3/uL — AB (ref 4.0–10.5)

## 2015-11-05 LAB — LIPASE, BLOOD: LIPASE: 46 U/L (ref 11–51)

## 2015-11-05 MED ORDER — ONDANSETRON 4 MG PO TBDP
4.0000 mg | ORAL_TABLET | Freq: Once | ORAL | Status: AC | PRN
  Administered 2015-11-05: 4 mg via ORAL
  Filled 2015-11-05: qty 1

## 2015-11-05 MED ORDER — PANTOPRAZOLE SODIUM 40 MG IV SOLR
40.0000 mg | INTRAVENOUS | Status: DC
  Administered 2015-11-05 – 2015-11-07 (×3): 40 mg via INTRAVENOUS
  Filled 2015-11-05 (×4): qty 40

## 2015-11-05 MED ORDER — PROMETHAZINE HCL 25 MG/ML IJ SOLN
25.0000 mg | Freq: Once | INTRAMUSCULAR | Status: AC
  Administered 2015-11-05: 25 mg via INTRAVENOUS
  Filled 2015-11-05: qty 1

## 2015-11-05 MED ORDER — HYDROMORPHONE HCL 1 MG/ML IJ SOLN
1.0000 mg | Freq: Once | INTRAMUSCULAR | Status: AC
  Administered 2015-11-05: 1 mg via INTRAVENOUS
  Filled 2015-11-05: qty 1

## 2015-11-05 MED ORDER — HYDROMORPHONE HCL 2 MG/ML IJ SOLN
2.0000 mg | INTRAMUSCULAR | Status: DC | PRN
  Administered 2015-11-06 – 2015-11-08 (×14): 2 mg via INTRAVENOUS
  Filled 2015-11-05 (×15): qty 1

## 2015-11-05 MED ORDER — ONDANSETRON HCL 4 MG/2ML IJ SOLN
4.0000 mg | Freq: Once | INTRAMUSCULAR | Status: AC
  Administered 2015-11-05: 4 mg via INTRAVENOUS
  Filled 2015-11-05: qty 2

## 2015-11-05 MED ORDER — ONDANSETRON HCL 4 MG/2ML IJ SOLN
4.0000 mg | Freq: Four times a day (QID) | INTRAMUSCULAR | Status: DC | PRN
  Administered 2015-11-06 – 2015-11-07 (×3): 4 mg via INTRAVENOUS
  Filled 2015-11-05 (×3): qty 2

## 2015-11-05 MED ORDER — HYDROMORPHONE HCL 1 MG/ML IJ SOLN: 1.0000 mg | Freq: Once | INTRAMUSCULAR | Status: DC

## 2015-11-05 MED ORDER — METOCLOPRAMIDE HCL 5 MG/ML IJ SOLN
10.0000 mg | Freq: Once | INTRAMUSCULAR | Status: AC
  Administered 2015-11-05: 10 mg via INTRAVENOUS
  Filled 2015-11-05: qty 2

## 2015-11-05 MED ORDER — METOCLOPRAMIDE HCL 5 MG/ML IJ SOLN
10.0000 mg | Freq: Four times a day (QID) | INTRAMUSCULAR | Status: DC
  Administered 2015-11-05 – 2015-11-08 (×10): 10 mg via INTRAVENOUS
  Filled 2015-11-05 (×17): qty 2

## 2015-11-05 MED ORDER — PROMETHAZINE HCL 25 MG/ML IJ SOLN
25.0000 mg | INTRAMUSCULAR | Status: DC | PRN
  Administered 2015-11-06 – 2015-11-07 (×6): 25 mg via INTRAVENOUS
  Filled 2015-11-05 (×6): qty 1

## 2015-11-05 MED ORDER — POTASSIUM CHLORIDE IN NACL 20-0.9 MEQ/L-% IV SOLN
INTRAVENOUS | Status: DC
  Administered 2015-11-05 – 2015-11-07 (×5): via INTRAVENOUS
  Filled 2015-11-05 (×7): qty 1000

## 2015-11-05 MED ORDER — ONDANSETRON HCL 4 MG/2ML IJ SOLN: 4.0000 mg | Freq: Three times a day (TID) | INTRAMUSCULAR | Status: DC | PRN

## 2015-11-05 MED ORDER — FENTANYL 25 MCG/HR TD PT72
25.0000 ug | MEDICATED_PATCH | TRANSDERMAL | Status: DC
  Administered 2015-11-05: 25 ug via TRANSDERMAL
  Filled 2015-11-05: qty 1

## 2015-11-05 MED ORDER — SODIUM CHLORIDE 0.9 % IV BOLUS (SEPSIS)
1000.0000 mL | Freq: Once | INTRAVENOUS | Status: AC
  Administered 2015-11-05: 1000 mL via INTRAVENOUS

## 2015-11-05 MED ORDER — HYDROMORPHONE HCL 2 MG/ML IJ SOLN
2.0000 mg | Freq: Once | INTRAMUSCULAR | Status: AC
  Administered 2015-11-05: 2 mg via INTRAVENOUS
  Filled 2015-11-05: qty 1

## 2015-11-05 NOTE — H&P (Signed)
[facility] History and Physical  [name] Mercado ZOX:[id number] DOB: [birth year] DOA: 11/05/2015  Referring physician: Carlene Coria, PA-C PCP: Sterling Big, MD   Chief Complaint: Abdominal pain.  HPI: [name] Mercado is a 29 y.o. male with a past medical history of Crohn's disease (on Humira being treated by [facility]), depression, anxiety, peripheral neuropathy, morbid obesity, chronic pain syndrome who comes to the emergency department with complaints of abdominal pain, plus multiple episodes of nausea and emesis since this morning. He denies fever, chills, diarrhea, constipation, melena or hematochezia. He denies dysuria, hematuria, flank pain or frequency. He states that it feels like his usual exacerbation symptoms of Crohn's disease, his pain is 7 out of 10 despite being medicated with 5 mg of Dilaudid, but the patient was unable to take his Nucynta ER 100 mg this morning or any of his PRN 15 mg oxycodone IR due to pain.   When seen in the ER, the patient was mildly anxious due to pain, but was otherwise in NAD.   Review of Systems:  Constitutional:  Positive for fatigue.  No weight loss, night sweats, Fevers,  HEENT:  No headaches, Difficulty swallowing,Tooth/dental problems,Sore throat,  No sneezing, itching, ear ache, nasal congestion, post nasal drip,  Cardio-vascular:  No chest pain, Orthopnea, PND, swelling in lower extremities, anasarca, dizziness, palpitations  GI:  Positive abdominal pain, nausea, vomiting and loss of appetite. No heartburn, indigestion,  diarrhea, change in bowel habits. Resp:  No shortness of breath with exertion or at rest. No excess mucus, no productive cough, No non-productive cough, No coughing up of blood.No change in color of mucus.No wheezing.No chest wall deformity  Skin:  no rash or lesions.  GU:  no dysuria, change in color of urine, no urgency or frequency. No flank pain.  Musculoskeletal:  No joint pain or swelling. No decreased  range of motion. No back pain.  Psych:  No change in mood or affect. No depression or anxiety. No memory loss.   Past Medical History  Diagnosis Date  . Crohn disease [facility])    Past Surgical History  Procedure Laterality Date  . Galbladder removed     Social History:  reports that he has been smoking Cigarettes.  He has been smoking about 0.50 packs per day. He does not have any smokeless tobacco history on file. He reports that he does not drink alcohol or use illicit drugs.  Allergies  Allergen Reactions  . Lansoprazole The patient uses pantoprazole at home without side effects.      No family history on file.   Prior to Admission medications   Medication Sig Start Date End Date Taking? Authorizing Provider  Adalimumab (HUMIRA PEN) 40 MG/0.8ML PNKT Inject 80 mg into the skin every 14 (fourteen) days.  08/22/15  Yes Historical Provider, MD  diphenhydrAMINE (BENADRYL) 25 MG tablet Take 25 mg by mouth at bedtime as needed for allergies.    Yes Historical Provider, MD  DULoxetine (CYMBALTA) 60 MG capsule TK 1 C PO QD 10/22/15  Yes Historical Provider, MD  gabapentin (NEURONTIN) 300 MG capsule Take 4 tablets by mouth at bedtime 07/30/15  Yes Historical Provider, MD  HORIZANT 600 MG TBCR TK 1 T PO BID PRN FOR NERVE PAIN 10/25/15  Yes Historical Provider, MD  LORazepam (ATIVAN) 1 MG tablet TK 1 T PO BID PRN FOR ANXIETY 10/22/15  Yes Historical Provider, MD  NUCYNTA ER 100 MG TB12 TK 1 T PO Q 12 H 10/24/15  Yes Historical  Provider, MD  ondansetron (ZOFRAN-ODT) 4 MG disintegrating tablet Take 4 mg by mouth every 8 (eight) hours as needed for nausea or vomiting.  04/04/15  Yes Historical Provider, MD  oxyCODONE (ROXICODONE) 15 MG immediate release tablet TK 1 T PO FID PRN FOR PAIN 10/22/15  Yes Historical Provider, MD  pantoprazole (PROTONIX) 40 MG tablet Take 40 mg by mouth daily as needed (indigestion).  05/06/15  Yes Historical Provider, MD  promethazine (PHENERGAN) 25 MG tablet Take 25 mg  by mouth every 8 (eight) hours as needed for nausea or vomiting.  04/04/15  Yes Historical Provider, MD   Physical Exam: Filed Vitals:   11/05/15 1202 11/05/15 1322 11/05/15 1538 11/05/15 1941  BP: 123/89 120/100 129/80 129/87  Pulse: 106 70 80 78  Temp: 97.5 F (36.4 C) 97.8 F (36.6 C)    TempSrc: Oral Oral    Resp: 20 18 18 18   Height: 5\' 10"  (1.778 m)     Weight: 131.543 kg (290 lb)     SpO2: 97% 100% 100% 99%    Wt Readings from Last 3 Encounters:  11/05/15 131.543 kg (290 lb)  04/30/09 126.554 kg (279 lb)  03/13/09 127.121 kg (280 lb 4 oz)    General:  Appears mildly anxious due to pain. Eyes: PERRL, normal lids, irises & conjunctiva ENT: grossly normal hearing, lips and oral mucosa are mildly dry. Neck: no LAD, masses or thyromegaly Cardiovascular: RRR, no m/r/g. No LE edema. Telemetry: SR, no arrhythmias  Respiratory: CTA bilaterally, no w/r/r. Normal respiratory effort. Abdomen: Bowel sounds positive, soft, positive epigastric and right lower quadrant tenderness, no guarding, no rebound tenderness. Skin: no rash or induration seen on limited exam Musculoskeletal: grossly normal tone BUE/BLE Psychiatric: grossly normal mood and affect, speech fluent and appropriate Neurologic: grossly non-focal.          Labs on Admission:  Basic Metabolic Panel:  Recent Labs Lab 11/05/15 1227  NA 140  K 3.8  CL 104  CO2 24  GLUCOSE 126*  BUN 11  CREATININE 0.83  CALCIUM 10.7*   Liver Function Tests:  Recent Labs Lab 11/05/15 1227  AST 46*  ALT 55  ALKPHOS 79  BILITOT <0.1*  PROT 8.5*  ALBUMIN 4.3    Recent Labs Lab 11/05/15 1227  LIPASE 46   CBC:  Recent Labs Lab 11/05/15 1227  WBC 13.4*  HGB 16.1  HCT 48.8  MCV 85.3  PLT 293    Assessment/Plan Principal Problem:   Intractable abdominal pain   VOMITING, PERSISTENT   Crohn's disease (HCC) Admit to MedSurg. Keep nothing by mouth. Per patient, his symptoms are very typical of his Crohn's  flares. He has had multiple CT scans of the abdomen this year, so I will defer CT scanning at this time. Consider repeat CT scan of abdomen if his symptoms do not improve or worsen. Continue analgesics and antiemetic medication. Consider adding glucocorticoid if no improvement.  Active Problems:   Depression Resume antidepressants once the patient is tolerating oral intake.    Chronic pain syndrome Continue Dilaudid when necessary. Since the patient uses Nucynta at home and got to 45 mg of oxycodone daily, I will use the Duragesic patch 25 g while the patient is nothing by mouth.     GERD Continue pantoprazole. I will use IV while the patient is nothing by mouth.       Code Status: Full code. DVT Prophylaxis: Mechanical with SCDs. Family Communication: His mother was by bedside. Disposition Plan: Admit  for pain and nausea control.  Time spent: Over 70 minutes were spent in the process of his admission.  Bobette Mo [facility] Pager [phone].

## 2015-11-05 NOTE — ED Provider Notes (Signed)
CSN: [id number]     Arrival date & time 11/05/15  1137 History   First MD Initiated Contact with Patient 11/05/15 1545     Chief Complaint  Patient presents with  . Abdominal Pain   HPI  [name] Mercado is an 29 y.o. male with history of Crohn's dz who presents to the ED for evaluation of abdominal pain, nausea, and vomiting. Pt states he woke up this AM with sharp epigastric pain. The pain has been associated with nausea and NBNB emesis. He states he has had "countless" episodes of emesis today. He has been unable to tolerate PO. He states that his current symptoms feel similar to prior Crohn's flares. He states he usually follows with [facility] and ED for management of his flares but is home in [place] for the holidays currently. He does not take chronic steroid therapy but does take Humira. He denies new symptoms. Denies fever, chills, diarrhea, chest pain, SOB. Denies urinary symptoms. He endorses smoking ~1/2 PPD.  He is unsure when his next GI f/u appt is, but states he is returning to [facility] in January and states he thinks he has follow-up then. Pt was given Zofran ODT in triage with no relief. He states he usually needs phenergan and Dilaudid at the [facility] ED. He is s/p cholecystectomy.  Past Medical History  Diagnosis Date  . Crohn disease [facility])    Past Surgical History  Procedure Laterality Date  . Galbladder removed     No family history on file. Social History  Substance Use Topics  . Smoking status: Current Every Day Smoker -- 0.50 packs/day    Types: Cigarettes  . Smokeless tobacco: None  . Alcohol Use: No    Review of Systems  All other systems reviewed and are negative.     Allergies  Lansoprazole  Home Medications   Prior to Admission medications   Medication Sig Start Date End Date Taking? Authorizing Provider  Adalimumab (HUMIRA PEN) 40 MG/0.8ML PNKT Inject 80 mg into the skin every 14 (fourteen) days.  08/22/15  Yes Historical Provider, MD  diphenhydrAMINE  (BENADRYL) 25 MG tablet Take 25 mg by mouth at bedtime as needed for allergies.    Yes Historical Provider, MD  DULoxetine (CYMBALTA) 60 MG capsule TK 1 C PO QD 10/22/15  Yes Historical Provider, MD  gabapentin (NEURONTIN) 300 MG capsule Take 4 tablets by mouth at bedtime 07/30/15  Yes Historical Provider, MD  HORIZANT 600 MG TBCR TK 1 T PO BID PRN FOR NERVE PAIN 10/25/15  Yes Historical Provider, MD  LORazepam (ATIVAN) 1 MG tablet TK 1 T PO BID PRN FOR ANXIETY 10/22/15  Yes Historical Provider, MD  NUCYNTA ER 100 MG TB12 TK 1 T PO Q 12 H 10/24/15  Yes Historical Provider, MD  ondansetron (ZOFRAN-ODT) 4 MG disintegrating tablet Take 4 mg by mouth every 8 (eight) hours as needed for nausea or vomiting.  04/04/15  Yes Historical Provider, MD  oxyCODONE (ROXICODONE) 15 MG immediate release tablet TK 1 T PO FID PRN FOR PAIN 10/22/15  Yes Historical Provider, MD  pantoprazole (PROTONIX) 40 MG tablet Take 40 mg by mouth daily as needed (indigestion).  05/06/15  Yes Historical Provider, MD  promethazine (PHENERGAN) 25 MG tablet Take 25 mg by mouth every 8 (eight) hours as needed for nausea or vomiting.  04/04/15  Yes Historical Provider, MD   BP 129/80 mmHg  Pulse 80  Temp(Src) 97.8 F (36.6 C) (Oral)  Resp 18  Ht  (  1.778 m)  Wt 131.543 kg  BMI 41.61 kg/m2  SpO2 100% Physical Exam  Constitutional: He is oriented to person, place, and time.  Pt is tearful, appears uncomfortable  HENT:  Right Ear: External ear normal.  Left Ear: External ear normal.  Nose: Nose normal.  Mouth/Throat: Oropharynx is clear and moist. No oropharyngeal exudate.  Eyes: Conjunctivae and EOM are normal. Pupils are equal, round, and reactive to light.  Neck: Normal range of motion. Neck supple.  Cardiovascular: Normal rate, regular rhythm, normal heart sounds and intact distal pulses.   Pulmonary/Chest: Effort normal and breath sounds normal. No respiratory distress. He has no wheezes. He exhibits no tenderness.   Abdominal: Soft. Bowel sounds are normal. He exhibits no distension. There is no tenderness. There is no rebound, no guarding and no CVA tenderness.  Musculoskeletal: He exhibits no edema.  Neurological: He is alert and oriented to person, place, and time. No cranial nerve deficit.  Skin: Skin is warm and dry. There is pallor.  Psychiatric: He has a normal mood and affect.  Nursing note and vitals reviewed.   ED Course  Procedures (including critical care time) Labs Review Labs Reviewed  COMPREHENSIVE METABOLIC PANEL - Abnormal; Notable for the following:    Glucose, Bld 126 (*)    Calcium 10.7 (*)    Total Protein 8.5 (*)    AST 46 (*)    Total Bilirubin <0.1 (*)    All other components within normal limits  CBC - Abnormal; Notable for the following:    WBC 13.4 (*)    All other components within normal limits  URINALYSIS, ROUTINE W REFLEX MICROSCOPIC (NOT AT [facility]) - Abnormal; Notable for the following:    APPearance CLOUDY (*)    pH 8.5 (*)    All other components within normal limits  LIPASE, BLOOD    Imaging Review No results found. I have personally reviewed and evaluated these images and lab results as part of my medical decision-making.   EKG Interpretation None      MDM   Final diagnoses:  Intractable pain  Crohn's disease with complication, unspecified gastrointestinal tract location (HCC)  Non-intractable vomiting with nausea, vomiting of unspecified type    Slightly elevated white count to 13.4. Labs otherwise unremarkable. Pt states his symptoms are consistent with prior Crohn's flares. He has a nonfocal abdominal exam. Given history of Crohn's with no new symptoms, nonfocal abdominal exam, I will hold off on new CT abd/pelvis now. Pt given total of 4mg  of dilaudid, 25mg  phenergan, 4mg  zofran, and 10mg  Reglan all IV in the ED. Pt continues to report excruciating abdominal pain and nausea. Given intractable pain will call hospitalist for admission.  Spoke  to Dr. Robb Matar and will admit to hospitalist service. Pt will be waiting in the ED for a while before admission and he is reporting worsening pain and nausea. Additional 1mg  dilaudid and 25mg  phenergan given.    [name] Coria, [name] Mercado 11/06/15 1054  [name] Barrette, MD 11/17/15 1023

## 2015-11-05 NOTE — ED Notes (Addendum)
Pt reports new onset upper middle abdominal pain and emesis with awakening; hx of Crohn's.

## 2015-11-06 ENCOUNTER — Encounter (HOSPITAL_COMMUNITY): Payer: Self-pay | Admitting: Radiology

## 2015-11-06 ENCOUNTER — Observation Stay (HOSPITAL_COMMUNITY): Payer: Managed Care, Other (non HMO)

## 2015-11-06 DIAGNOSIS — K5000 Crohn's disease of small intestine without complications: Secondary | ICD-10-CM

## 2015-11-06 DIAGNOSIS — R1110 Vomiting, unspecified: Secondary | ICD-10-CM

## 2015-11-06 DIAGNOSIS — K219 Gastro-esophageal reflux disease without esophagitis: Secondary | ICD-10-CM

## 2015-11-06 LAB — CBC WITH DIFFERENTIAL/PLATELET
Basophils Absolute: 0 10*3/uL (ref 0.0–0.1)
Basophils Relative: 0 %
EOS PCT: 1 %
Eosinophils Absolute: 0.1 10*3/uL (ref 0.0–0.7)
HEMATOCRIT: 44.6 % (ref 39.0–52.0)
HEMOGLOBIN: 14.6 g/dL (ref 13.0–17.0)
LYMPHS ABS: 3.7 10*3/uL (ref 0.7–4.0)
LYMPHS PCT: 21 %
MCH: 28 pg (ref 26.0–34.0)
MCHC: 32.7 g/dL (ref 30.0–36.0)
MCV: 85.6 fL (ref 78.0–100.0)
Monocytes Absolute: 1 10*3/uL (ref 0.1–1.0)
Monocytes Relative: 6 %
NEUTROS ABS: 12.5 10*3/uL — AB (ref 1.7–7.7)
Neutrophils Relative %: 72 %
PLATELETS: 307 10*3/uL (ref 150–400)
RBC: 5.21 MIL/uL (ref 4.22–5.81)
RDW: 14.1 % (ref 11.5–15.5)
WBC: 17.4 10*3/uL — AB (ref 4.0–10.5)

## 2015-11-06 LAB — COMPREHENSIVE METABOLIC PANEL
ALBUMIN: 3.8 g/dL (ref 3.5–5.0)
ALT: 41 U/L (ref 17–63)
AST: 42 U/L — AB (ref 15–41)
Alkaline Phosphatase: 68 U/L (ref 38–126)
Anion gap: 11 (ref 5–15)
BUN: 11 mg/dL (ref 6–20)
CHLORIDE: 108 mmol/L (ref 101–111)
CO2: 23 mmol/L (ref 22–32)
Calcium: 9 mg/dL (ref 8.9–10.3)
Creatinine, Ser: 0.94 mg/dL (ref 0.61–1.24)
GFR calc Af Amer: >60 mL/min (ref >60)
Glucose, Bld: 92 mg/dL (ref 65–99)
POTASSIUM: 4.3 mmol/L (ref 3.5–5.1)
Sodium: 142 mmol/L (ref 135–145)
Total Bilirubin: 1.4 mg/dL — ABNORMAL HIGH (ref 0.3–1.2)
Total Protein: 7.8 g/dL (ref 6.5–8.1)

## 2015-11-06 MED ORDER — IOHEXOL 300 MG/ML  SOLN
25.0000 mL | INTRAMUSCULAR | Status: AC
  Administered 2015-11-06 (×2): 25 mL via ORAL

## 2015-11-06 MED ORDER — IOHEXOL 300 MG/ML  SOLN
100.0000 mL | Freq: Once | INTRAMUSCULAR | Status: AC | PRN
  Administered 2015-11-06: 100 mL via INTRAVENOUS

## 2015-11-06 NOTE — Progress Notes (Signed)
Initial Nutrition Assessment  DOCUMENTATION CODES:   Morbid obesity  INTERVENTION:  - Diet advancement as medically feasible - RD will continue to monitor for needs  NUTRITION DIAGNOSIS:   Inadequate oral intake related to inability to eat as evidenced by NPO status.  GOAL:   Patient will meet greater than or equal to 90% of their needs  MONITOR:   Diet advancement, Weight trends, Labs, I & O's  REASON FOR ASSESSMENT:   Malnutrition Screening Tool  ASSESSMENT:   29 y.o. male with a past medical history of Crohn's disease (on Humira being treated by [facility]), depression, anxiety, peripheral neuropathy, morbid obesity, chronic pain syndrome who comes to the emergency department with complaints of abdominal pain, plus multiple episodes of nausea and emesis since this morning. He denies fever, chills, diarrhea, constipation, melena or hematochezia. He denies dysuria, hematuria, flank pain or frequency. He states that it feels like his usual exacerbation symptoms of Crohn's disease, his pain is 7 out of 10 despite being medicated with 5 mg of Dilaudid, but the patient was unable to take his Nucynta ER 100 mg this morning or any of his PRN 15 mg oxycodone IR due to pain.   Pt seen for MST. BMI indicates morbid obesity. Pt has been NPO since admission and unable to meet needs. Pt reports ongoing issues with Crohn's disease for the past 10 years. He states that some years are worse than others and that this year "has been a particularly bad year" as it relates to his symptoms. Pt states that he has abdominal pain and emesis with many items, especially corn, soy, wheat, and dairy products. He has seen an outpatient dietitian several times in the past but states that this mainly confirmed what he already knew and that it was not overly helpful for him. He tolerates fluids, including fruit juices, well except for pineapple juice (which causes sores in his mouth). He often drinks orange juice for  potassium.   Pt states that with his symptoms that his weight often fluctuates. He states that most recently, he has lost 30-40 lbs (9-12% body weight) in the past 1 year. This weight loss is not significant for time frame. Will need to complete physical assessment on follow-up.   Pt is unable to meet needs at this time. Will monitor for needs and areas for assistance once diet has been advanced. Medications reviewed. Labs reviewed; AST elevated.   Diet Order:  Diet NPO time specified Except for: Ice Chips  Skin:  Reviewed, no issues  Last BM:  12/26  Height:   Ht Readings from Last 1 Encounters:  11/05/15 5\' 10"  (1.778 m)    Weight:   Wt Readings from Last 1 Encounters:  11/05/15 290 lb (131.543 kg)    Ideal Body Weight:  75.45 kg (kg)  BMI:  Body mass index is 41.61 kg/(m^2).  Estimated Nutritional Needs:   Kcal:  2100-2300  Protein:  115-125 grams of protein  Fluid:  2.3-2.6 L/day  EDUCATION NEEDS:   No education needs identified at this time     Trenton Gammon, RD, LDN Inpatient Clinical Dietitian Pager # [phone] After hours/weekend pager # [phone]

## 2015-11-06 NOTE — Progress Notes (Signed)
[facility] PROGRESS NOTE  [name] Mercado FGB:[id number] DOB: [birth year] DOA: 11/05/2015 PCP: Sterling Big, MD  Brief narrative 29 year old obese male with history of Crohn's disease (on Humira and sees Dr. Caryl Ada at [facility], last seen September 2016 and was on slow prednisone taper until recently),  anxiety, depression, peripheral neuropathy, chronic pain on narcotics presented to the ED with abdominal pain and multiple episodes of nausea and vomiting on the day of admission. He denied any fevers, chills, diarrhea, or blood per rectum. Reported history similar to use Crohn's flareup. Patient reportedly unable to take his home pain medications. Admitted for further management.    Assessment/Plan: Acute exacerbation of Crohn's Started on clear liquid. Last CT from 03/2015 showing new lesions in terminal ileum. He has been on humira and slow prednisone taper until recently. Patient still nauseous and has abdominal pain. CT of his abdomen and pelvis done which was negative for acute findings.. Continue pain control with IV Dilaudid , along with supportive care with Reglan and Phenergan. Continue IV fluids.   continue PPI. Monitor WBC. Hold off on steroids at this time.  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 Advance diet as tolerated.    DVT prophylaxis: SCDs Diet: Clear liquid      Code Status:Full code family Communication: none at bedside Disposition Plan: home once improved , 1-2 days   Consultants:  none  Procedures:  CT abd and pelvis  Antibiotics:  none  HPI/Subjective: See and examined still has abdominal pain and nausea  Objective: Filed Vitals:   11/06/15 0535 11/06/15 1417  BP: 115/66 109/70  Pulse: 96 68  Temp: 98.7 F (37.1 C) 98 F (36.7 C)  Resp: 18 18    Intake/Output Summary (Last 24 hours) at 11/06/15 1426 Last data filed at 11/06/15 1100  Gross per 24 hour  Intake  1039.58 ml  Output    600 ml  Net 439.58 ml   Filed Weights   11/05/15 1202  Weight: 131.543 kg (290 lb)    Exam:   General:  middle aged obese male not in distress  HEENT: No pallor, dry mucosa, supple neck  Chest: Clear bilaterally  CVS: Normal S1 and S2, no murmurs  GI soft, nondistended, no abdominal tenderness, sounds present  Musculoskeletal: Warm, no edema   Data Reviewed: Basic Metabolic Panel:  Recent Labs Lab 11/05/15 1227 11/06/15 0600  NA 140 142  K 3.8 4.3  CL 104 108  CO2 24 23  GLUCOSE 126* 92  BUN 11 11  CREATININE 0.83 0.94  CALCIUM 10.7* 9.0   Liver Function Tests:  Recent Labs Lab 11/05/15 1227 11/06/15 0600  AST 46* 42*  ALT 55 41  ALKPHOS 79 68  BILITOT <0.1* 1.4*  PROT 8.5* 7.8  ALBUMIN 4.3 3.8    Recent Labs Lab 11/05/15 1227  LIPASE 46   No results for input(s): AMMONIA in the last 168 hours. CBC:  Recent Labs Lab 11/05/15 1227 11/06/15 0600  WBC 13.4* 17.4*  NEUTROABS  --  12.5*  HGB 16.1 14.6  HCT 48.8 44.6  MCV 85.3 85.6  PLT 293 307   Cardiac Enzymes: No results for input(s): CKTOTAL, CKMB, CKMBINDEX, TROPONINI in the last 168 hours. BNP (last 3 results) No results for input(s): BNP in the last 8760 hours.  ProBNP (last 3 results) No results for input(s): PROBNP in the last 8760 hours.  CBG: No results for input(s): GLUCAP  in the last 168 hours.  No results found for this or any previous visit (from the past 240 hour(s)).   Studies: Ct Abdomen Pelvis W Contrast  11/06/2015  CLINICAL DATA:  29 year old male with acute abdominal pain nausea and vomiting. History of Crohn's disease. EXAM: CT ABDOMEN AND PELVIS WITH CONTRAST TECHNIQUE: Multidetector CT imaging of the abdomen and pelvis was performed using the standard protocol following bolus administration of intravenous contrast. CONTRAST:  OMNIPAQUE IOHEXOL 300 MG/ML  SOLN COMPARISON:  11/11/2005 CT FINDINGS: Lower chest:  Unremarkable  Hepatobiliary: Hepatic steatosis again identified. No focal hepatic abnormalities are noted. Patient is status post cholecystectomy. There is no evidence of biliary dilatation. Pancreas: Unremarkable Spleen: Unremarkable Adrenals/Urinary Tract: Nonobstructing bilateral renal calculi are identified, the largest measuring 6 mm in the right upper pole. There is no evidence of hydronephrosis, renal mass or are obstructing urinary calculi. The adrenal glands and bladder are unremarkable. Stomach/Bowel: There is no evidence of focal bowel wall thickening, bowel obstruction, fistula or adjacent inflammation. Vascular/Lymphatic: Unremarkable. No enlarged lymph nodes or abdominal aortic aneurysm. Reproductive: Prostate unremarkable Other: No free fluid, pneumoperitoneum or abscess. Musculoskeletal: No acute or suspicious abnormalities. IMPRESSION: No evidence of acute abnormality. Nonobstructing bilateral renal calculi. Hepatic steatosis. Electronically Signed   By: Harmon Pier M.D.   On: 11/06/2015 13:20    Scheduled Meds: . fentaNYL  25 mcg Transdermal Q72H  . metoCLOPramide (REGLAN) injection  10 mg Intravenous 4 times per day  . pantoprazole (PROTONIX) IV  40 mg Intravenous Q24H   Continuous Infusions: . 0.9 % NaCl with KCl 20 mEq / L 125 mL/hr at 11/06/15 1610      Time spent: 25 MINUTES    [name]  [facility] Pager [phone]. If 7PM-7AM, please contact night-coverage at [email or web address], password [facility] 11/06/2015, 2:26 PM

## 2015-11-07 DIAGNOSIS — R109 Unspecified abdominal pain: Secondary | ICD-10-CM

## 2015-11-07 DIAGNOSIS — G894 Chronic pain syndrome: Secondary | ICD-10-CM

## 2015-11-07 LAB — CBC
HCT: 38.1 % — ABNORMAL LOW (ref 39.0–52.0)
HEMOGLOBIN: 12 g/dL — AB (ref 13.0–17.0)
MCH: 27.5 pg (ref 26.0–34.0)
MCHC: 31.5 g/dL (ref 30.0–36.0)
MCV: 87.2 fL (ref 78.0–100.0)
PLATELETS: 205 10*3/uL (ref 150–400)
RBC: 4.37 MIL/uL (ref 4.22–5.81)
RDW: 14.2 % (ref 11.5–15.5)
WBC: 9.7 10*3/uL (ref 4.0–10.5)

## 2015-11-07 MED ORDER — PROMETHAZINE HCL 25 MG/ML IJ SOLN
12.5000 mg | Freq: Four times a day (QID) | INTRAMUSCULAR | Status: DC | PRN
  Administered 2015-11-07 – 2015-11-08 (×2): 12.5 mg via INTRAVENOUS
  Filled 2015-11-07 (×2): qty 1

## 2015-11-07 MED ORDER — POTASSIUM CHLORIDE IN NACL 20-0.9 MEQ/L-% IV SOLN
INTRAVENOUS | Status: DC
  Administered 2015-11-07 (×2): via INTRAVENOUS
  Filled 2015-11-07 (×2): qty 1000

## 2015-11-07 NOTE — Progress Notes (Signed)
PROGRESS NOTE    [name] Mercado WJX:[id number] DOB: Jun 05, 1986 DOA: 11/05/2015 PCP: Sterling Big, MD  HPI/Brief narrative 29 year old obese male with history of Crohn's disease (on Humira and sees Dr. Caryl Ada at [facility], last seen September 2016 and completed slow prednisone taper 1.5 months ago), anxiety, depression, peripheral neuropathy, chronic pain on high-dose narcotics (follows with Dr. Pernell Dupre at preferred pain management) presented to the ED with abdominal pain and multiple episodes of nausea and vomiting on the day of admission. He denied any fevers, chills, diarrhea, or blood per rectum. Reported history similar to use Crohn's flareup. Admitted for further management.   Assessment/Plan:  Abdominal pain, nausea and vomiting - DD: Acute exacerbation of Crohn's versus acute viral GE versus other etiology - CT abdomen and pelvis with contrast 11/06/15: No acute abnormalities - Treated supportively with clear liquid diet, IV fluids, pain management and antiemetics. States that he feels somewhat better but still complains of nausea and a couple of episodes of nonbloody emesis over the last 24 hours. Has chronic unchanged loose stools.  - Continue to hold off on starting steroids.  - If he does not improve by tomorrow, consider GI consultation.   Chronic pain - Follows with outpatient pain M.D. - Apparently on high-dose narcotics at home and tolerant of same. -Controlled    DVT prophylaxis: SCDs  Code Status: Full  Family Communication: None at bedside  Disposition Plan: DC home when medically stable    Consultants:  None   Procedures:  None   Antibiotics:  None    Subjective: States that he feels somewhat better but still complains of nausea and a couple of episodes of nonbloody emesis over the last 24 hours.   Objective: Filed Vitals:   11/06/15 1417 11/06/15 2124 11/07/15 0508 11/07/15 1406  BP: 109/70 121/73 130/77 115/68  Pulse: 68 74 69 69  Temp: 98  F (36.7 C) 98 F (36.7 C) 98.3 F (36.8 C) 98.3 F (36.8 C)  TempSrc: Oral Oral Oral Oral  Resp: Height:      Weight:      SpO2: 94% 98% 97% 97%    Intake/Output Summary (Last 24 hours) at 11/07/15 1731 Last data filed at 11/07/15 1455  Gross per 24 hour  Intake   4360 ml  Output      0 ml  Net   4360 ml   Filed Weights   11/05/15 1202  Weight: 131.543 kg (290 lb)     Exam:  General exam: Pleasant young male ambulating comfortably in the room. Respiratory system: Clear. No increased work of breathing. Cardiovascular system: S1 & S2 heard, RRR. No JVD, murmurs, gallops, clicks or pedal edema. Gastrointestinal system: Abdomen is nondistended, soft and nontender. Normal bowel sounds heard. Central nervous system: Alert and oriented. No focal neurological deficits. Extremities: Symmetric 5 x 5 power.   Data Reviewed: Basic Metabolic Panel:  Recent Labs Lab 11/05/15 1227 11/06/15 0600  NA 140 142  K 3.8 4.3  CL 104 108  CO2 24 23  GLUCOSE 126* 92  BUN 11 11  CREATININE 0.83 0.94  CALCIUM 10.7* 9.0   Liver Function Tests:  Recent Labs Lab 11/05/15 1227 11/06/15 0600  AST 46* 42*  ALT 55 41  ALKPHOS 79 68  BILITOT <0.1* 1.4*  PROT 8.5* 7.8  ALBUMIN 4.3 3.8    Recent Labs Lab 11/05/15 1227  LIPASE 46   No results for input(s): AMMONIA in the last 168 hours.  CBC:  Recent Labs Lab 11/05/15 1227 11/06/15 0600 11/07/15 0440  WBC 13.4* 17.4* 9.7  NEUTROABS  --  12.5*  --   HGB 16.1 14.6 12.0*  HCT 48.8 44.6 38.1*  MCV 85.3 85.6 87.2  PLT 293 307 205   Cardiac Enzymes: No results for input(s): CKTOTAL, CKMB, CKMBINDEX, TROPONINI in the last 168 hours. BNP (last 3 results) No results for input(s): PROBNP in the last 8760 hours. CBG: No results for input(s): GLUCAP in the last 168 hours.  No results found for this or any previous visit (from the past 240 hour(s)).         Studies: Ct Abdomen Pelvis W  Contrast  11/06/2015  CLINICAL DATA:  29 year old male with acute abdominal pain nausea and vomiting. History of Crohn's disease. EXAM: CT ABDOMEN AND PELVIS WITH CONTRAST TECHNIQUE: Multidetector CT imaging of the abdomen and pelvis was performed using the standard protocol following bolus administration of intravenous contrast. CONTRAST:  OMNIPAQUE IOHEXOL 300 MG/ML  SOLN COMPARISON:  11/11/2005 CT FINDINGS: Lower chest:  Unremarkable Hepatobiliary: Hepatic steatosis again identified. No focal hepatic abnormalities are noted. Patient is status post cholecystectomy. There is no evidence of biliary dilatation. Pancreas: Unremarkable Spleen: Unremarkable Adrenals/Urinary Tract: Nonobstructing bilateral renal calculi are identified, the largest measuring 6 mm in the right upper pole. There is no evidence of hydronephrosis, renal mass or are obstructing urinary calculi. The adrenal glands and bladder are unremarkable. Stomach/Bowel: There is no evidence of focal bowel wall thickening, bowel obstruction, fistula or adjacent inflammation. Vascular/Lymphatic: Unremarkable. No enlarged lymph nodes or abdominal aortic aneurysm. Reproductive: Prostate unremarkable Other: No free fluid, pneumoperitoneum or abscess. Musculoskeletal: No acute or suspicious abnormalities. IMPRESSION: No evidence of acute abnormality. Nonobstructing bilateral renal calculi. Hepatic steatosis. Electronically Signed   By: Harmon Pier M.D.   On: 11/06/2015 13:20        Scheduled Meds: . fentaNYL  25 mcg Transdermal Q72H  . metoCLOPramide (REGLAN) injection  10 mg Intravenous 4 times per day  . pantoprazole (PROTONIX) IV  40 mg Intravenous Q24H   Continuous Infusions: . 0.9 % NaCl with KCl 20 mEq / L 125 mL/hr at 11/07/15 1117    Principal Problem:   Intractable abdominal pain Active Problems:   Depression   Chronic pain syndrome   GERD   VOMITING, PERSISTENT   Crohn's disease (HCC)    Time spent: 25  minutes.    Marcellus Scott, MD, FACP, FHM. [facility] Pager [phone]  If 7PM-7AM, please contact night-coverage [email or web address] Password [email or web address] 11/07/2015, 5:31 PM    LOS: 1 day

## 2015-11-08 DIAGNOSIS — R112 Nausea with vomiting, unspecified: Secondary | ICD-10-CM

## 2015-11-08 DIAGNOSIS — K5010 Crohn's disease of large intestine without complications: Secondary | ICD-10-CM | POA: Insufficient documentation

## 2015-11-08 DIAGNOSIS — R1110 Vomiting, unspecified: Secondary | ICD-10-CM | POA: Insufficient documentation

## 2015-11-08 DIAGNOSIS — K50119 Crohn's disease of large intestine with unspecified complications: Secondary | ICD-10-CM

## 2015-11-08 LAB — CBC
HCT: 39.8 % (ref 39.0–52.0)
HEMOGLOBIN: 12.7 g/dL — AB (ref 13.0–17.0)
MCH: 27.4 pg (ref 26.0–34.0)
MCHC: 31.9 g/dL (ref 30.0–36.0)
MCV: 86 fL (ref 78.0–100.0)
Platelets: 223 10*3/uL (ref 150–400)
RBC: 4.63 MIL/uL (ref 4.22–5.81)
RDW: 13.9 % (ref 11.5–15.5)
WBC: 9.3 10*3/uL (ref 4.0–10.5)

## 2015-11-08 LAB — BASIC METABOLIC PANEL
Anion gap: 11 (ref 5–15)
BUN: <5 mg/dL — ABNORMAL LOW (ref 6–20)
CHLORIDE: 107 mmol/L (ref 101–111)
CO2: 21 mmol/L — ABNORMAL LOW (ref 22–32)
CREATININE: 0.68 mg/dL (ref 0.61–1.24)
Calcium: 8.5 mg/dL — ABNORMAL LOW (ref 8.9–10.3)
GFR calc non Af Amer: >60 mL/min (ref >60)
Glucose, Bld: 77 mg/dL (ref 65–99)
Potassium: 4 mmol/L (ref 3.5–5.1)
SODIUM: 139 mmol/L (ref 135–145)

## 2015-11-08 MED ORDER — OXYCODONE HCL 5 MG PO TABS
15.0000 mg | ORAL_TABLET | ORAL | Status: DC | PRN
  Administered 2015-11-08: 15 mg via ORAL
  Filled 2015-11-08: qty 3

## 2015-11-08 MED ORDER — PROMETHAZINE HCL 25 MG PO TABS
12.5000 mg | ORAL_TABLET | Freq: Four times a day (QID) | ORAL | Status: DC | PRN
  Administered 2015-11-08: 12.5 mg via ORAL
  Filled 2015-11-08: qty 1

## 2015-11-08 NOTE — Discharge Summary (Signed)
Physician Discharge Summary  Baer Snay [id number] DOB: 1986-05-12 DOA: 11/05/2015  PCP: Sterling Big, MD  Admit date: 11/05/2015 Discharge date: 11/08/2015  Time spent: Less than 30 minutes  Recommendations for Outpatient Follow-up:  1. Dr. Cristie Hem, PCP in 5 days. 2. Dr. Caryl Ada, Gastroenterologist at [facility], in 1 week  Discharge Diagnoses:  Principal Problem:   Intractable abdominal pain Active Problems:   Depression   Chronic pain syndrome   GERD   VOMITING, PERSISTENT   Crohn's disease (HCC)   Crohn's colitis (HCC)   Emesis   Discharge Condition: Improved & Stable  Diet recommendation: Soft diet.  Filed Weights   11/05/15 1202  Weight: 131.543 kg (290 lb)    History of present illness:  29 year old obese male with history of Crohn's disease (on Humira and sees Dr. Caryl Ada at [facility], last seen September 2016 and completed slow prednisone taper 1.5 months ago), anxiety, depression, peripheral neuropathy, chronic pain on high-dose narcotics (follows with Dr. Pernell Dupre at preferred pain management) presented to the ED with abdominal pain and multiple episodes of nausea and vomiting on the day of admission. He denied any fevers, chills, diarrhea, or blood per rectum. Reported history similar to use Crohn's flareup. Admitted for further management.    Hospital Course:   Abdominal pain, nausea and vomiting - DD: Acute exacerbation of Crohn's versus acute viral GE versus other etiology - CT abdomen and pelvis with contrast 11/06/15: No acute abnormalities - Treated supportively with clear liquid diet, IV fluids, pain management and antiemetics.  - Did not get steroids or antibiotics - Patient gradually improved. Tolerated clear liquids over the last 24 hours. Advance diet to soft diet for lunch today which she tolerated without worsening abdominal pain. No nausea or vomiting. Chronic unchanged loose stools. - Patient was discharged home in stable  condition and advised to follow-up with his PCP in the next few days and gastroenterologist as needed.  Chronic pain - Follows with outpatient pain M.D. - on high-dose narcotics at home and tolerant of same. - Controlled   Morbid obesity/Body mass index is 41.61 kg/(m^2). - will need outpatient dieting, exercise and weight loss.  Consultants:  None  Procedures:  None  Antibiotics:  None   Discharge Exam:  Complaints: Feels better today. No nausea or vomiting over the last 24 hours. Tolerated clear liquids over the last 24 hours. Advanced diet to soft diet for lunch which she tolerated without worsening abdominal pain, nausea or vomiting. Eager to go home.  Filed Vitals:   11/07/15 0508 11/07/15 1406 11/07/15 2056 11/08/15 0459  BP: 130/77 115/68 126/67 140/84  Pulse: 69 69 80 96  Temp: 98.3 F (36.8 C) 98.3 F (36.8 C) 99.6 F (37.6 C) 98.6 F (37 C)  TempSrc: Oral Oral Oral Oral  Resp: 18 19 18 18   Height:      Weight:      SpO2: 97% 97% 99% 96%    General exam: Pleasant young male ambulating comfortably in the room. Respiratory system: Clear. No increased work of breathing. Cardiovascular system: S1 & S2 heard, RRR. No JVD, murmurs, gallops, clicks or pedal edema. Gastrointestinal system: Abdomen is nondistended, soft and nontender. Normal bowel sounds heard. Central nervous system: Alert and oriented. No focal neurological deficits. Extremities: Symmetric 5 x 5 power  Discharge Instructions      Discharge Instructions    Call MD for:  difficulty breathing, headache or visual disturbances    Complete by:  As directed  Call MD for:  extreme fatigue    Complete by:  As directed      Call MD for:  persistant dizziness or light-headedness    Complete by:  As directed      Call MD for:  persistant nausea and vomiting    Complete by:  As directed      Call MD for:  severe uncontrolled pain    Complete by:  As directed      Call MD for:  temperature  >100.4    Complete by:  As directed      Discharge instructions    Complete by:  As directed   Diet: Soft diet.  Diet: Soft diet.     Increase activity slowly    Complete by:  As directed             Medication List    STOP taking these medications        gabapentin 300 MG capsule  Commonly known as:  NEURONTIN     ondansetron 4 MG disintegrating tablet  Commonly known as:  ZOFRAN-ODT      TAKE these medications        diphenhydrAMINE 25 MG tablet  Commonly known as:  BENADRYL  Take 25 mg by mouth at bedtime as needed for allergies.     DULoxetine 60 MG capsule  Commonly known as:  CYMBALTA  TK 1 C PO QD     HORIZANT 600 MG Tbcr  Generic drug:  Gabapentin Enacarbil  TK 1 T PO BID PRN FOR NERVE PAIN     HUMIRA PEN 40 MG/0.8ML Pnkt  Generic drug:  Adalimumab  Inject 80 mg into the skin every 14 (fourteen) days.     LORazepam 1 MG tablet  Commonly known as:  ATIVAN  TK 1 T PO BID PRN FOR ANXIETY     NUCYNTA ER 100 MG Tb12  Generic drug:  Tapentadol HCl  TK 1 T PO Q 12 H     oxyCODONE 15 MG immediate release tablet  Commonly known as:  ROXICODONE  TK 1 T PO q4h prn pain     pantoprazole 40 MG tablet  Commonly known as:  PROTONIX  Take 40 mg by mouth daily as needed (indigestion).     promethazine 25 MG tablet  Commonly known as:  PHENERGAN  Take 25 mg by mouth every 8 (eight) hours as needed for nausea or vomiting.       Follow-up Information    Follow up with MANES, AMRITPAL K, MD. Schedule an appointment as soon as possible for a visit in 5 days.   Specialty:  Family Medicine   Contact information:   [address] [place] [address] [phone]       Follow up with Caryl Ada, MD. Schedule an appointment as soon as possible for a visit in 1 week.   Specialty:  Internal Medicine   Contact information:   [address] [place] [address] [phone]        The results of significant diagnostics  from this hospitalization (including imaging, microbiology, ancillary and laboratory) are listed below for reference.    Significant Diagnostic Studies: Ct Abdomen Pelvis W Contrast  11/06/2015  CLINICAL DATA:  29 year old male with acute abdominal pain nausea and vomiting. History of Crohn's disease. EXAM: CT ABDOMEN AND PELVIS WITH CONTRAST TECHNIQUE: Multidetector CT imaging of the abdomen and pelvis was performed using the standard protocol following bolus administration of intravenous contrast.  CONTRAST:  OMNIPAQUE IOHEXOL 300 MG/ML  SOLN COMPARISON:  11/11/2005 CT FINDINGS: Lower chest:  Unremarkable Hepatobiliary: Hepatic steatosis again identified. No focal hepatic abnormalities are noted. Patient is status post cholecystectomy. There is no evidence of biliary dilatation. Pancreas: Unremarkable Spleen: Unremarkable Adrenals/Urinary Tract: Nonobstructing bilateral renal calculi are identified, the largest measuring 6 mm in the right upper pole. There is no evidence of hydronephrosis, renal mass or are obstructing urinary calculi. The adrenal glands and bladder are unremarkable. Stomach/Bowel: There is no evidence of focal bowel wall thickening, bowel obstruction, fistula or adjacent inflammation. Vascular/Lymphatic: Unremarkable. No enlarged lymph nodes or abdominal aortic aneurysm. Reproductive: Prostate unremarkable Other: No free fluid, pneumoperitoneum or abscess. Musculoskeletal: No acute or suspicious abnormalities. IMPRESSION: No evidence of acute abnormality. Nonobstructing bilateral renal calculi. Hepatic steatosis. Electronically Signed   By: Harmon Pier M.D.   On: 11/06/2015 13:20    Microbiology: No results found for this or any previous visit (from the past 240 hour(s)).   Labs: Basic Metabolic Panel:  Recent Labs Lab 11/05/15 1227 11/06/15 0600 11/08/15 0530  NA 140 142 139  K 3.8 4.3 4.0  CL 104 108 107  CO2 24 23 21*  GLUCOSE 126* 92 77  BUN 11 11 <5*  CREATININE  0.83 0.94 0.68  CALCIUM 10.7* 9.0 8.5*   Liver Function Tests:  Recent Labs Lab 11/05/15 1227 11/06/15 0600  AST 46* 42*  ALT 55 41  ALKPHOS 79 68  BILITOT <0.1* 1.4*  PROT 8.5* 7.8  ALBUMIN 4.3 3.8    Recent Labs Lab 11/05/15 1227  LIPASE 46   No results for input(s): AMMONIA in the last 168 hours. CBC:  Recent Labs Lab 11/05/15 1227 11/06/15 0600 11/07/15 0440 11/08/15 0530  WBC 13.4* 17.4* 9.7 9.3  NEUTROABS  --  12.5*  --   --   HGB 16.1 14.6 12.0* 12.7*  HCT 48.8 44.6 38.1* 39.8  MCV 85.3 85.6 87.2 86.0  PLT 293 307 205 223   Cardiac Enzymes: No results for input(s): CKTOTAL, CKMB, CKMBINDEX, TROPONINI in the last 168 hours. BNP: BNP (last 3 results) No results for input(s): BNP in the last 8760 hours.  ProBNP (last 3 results) No results for input(s): PROBNP in the last 8760 hours.  CBG: No results for input(s): GLUCAP in the last 168 hours.      Signed:  Marcellus Scott, MD, FACP, FHM. [facility] Pager [phone]  If 7PM-7AM, please contact night-coverage [email or web address] Password [facility] 11/08/2015, 3:42 PM

## 2015-11-08 NOTE — Progress Notes (Signed)
Nutrition Follow-up  DOCUMENTATION CODES:   Morbid obesity  INTERVENTION:  - Diet advancement as medically feasible - RD will continue to monitor for needs  NUTRITION DIAGNOSIS:   Inadequate protein intake related to other (see comment) (current diet order) as evidenced by other (see comment) (CLD does not meet protein needs.). -revised  GOAL:   Patient will meet greater than or equal to 90% of their needs -unmet on CLD  MONITOR:   PO intake, Diet advancement, Weight trends, Labs, I & O's  ASSESSMENT:   29 y.o. male with a past medical history of Crohn's disease (on Humira being treated by [facility]), depression, anxiety, peripheral neuropathy, morbid obesity, chronic pain syndrome who comes to the emergency department with complaints of abdominal pain, plus multiple episodes of nausea and emesis since this morning. He denies fever, chills, diarrhea, constipation, melena or hematochezia. He denies dysuria, hematuria, flank pain or frequency. He states that it feels like his usual exacerbation symptoms of Crohn's disease, his pain is 7 out of 10 despite being medicated with 5 mg of Dilaudid, but the patient was unable to take his Nucynta ER 100 mg this morning or any of his PRN 15 mg oxycodone IR due to pain.   12/29 Pt's diet advanced to CLD since initial assessment and chart review indicates pt consumed 100% of breakfast and 75% of lunch yesterday (12/28). Physical assessment completed and shows no signs of muscle or fat wasting. Pt states he has been experiencing ongoing abdominal pain with intermittent nausea; nausea typically occurs with PO intakes and/or exacerbation of pain. Pt states that he last vomited yesterday after breakfast. Pt is very interested in diet advancement at this time and is hopeful to talk with MD about this.   Pt asks RD about nightshades (examples: eggplant and potatoes) and if he should eliminate these items from his diet. Encouraged pt to first try cooking  these items to be very tender (can easily be cut with a fork) and assess tolerance. Informed him that he has already needed to adjust diet extensively and would like him to have as much variety as possible. If this does not help, pt may need to cut these items out of his diet as well.   This RD will not be in the hospital for the rest of the week but informed pt that another RD would be checking in as diet is advanced should he have questions/concerns or need anything. Unable to meet needs currently. Medications reviewed. Labs reviewed; BUN <5, Ca: 8.5 mg/dL.    12/27 - Pt has been NPO since admission and unable to meet needs.  - Pt reports ongoing issues with Crohn's disease for the past 10 years.  - He states that some years are worse than others and that this year "has been a particularly bad year" as it relates to his symptoms.  - Pt states that he has abdominal pain and emesis with many items, especially corn, soy, wheat, and dairy products.  - He has seen an outpatient dietitian several times in the past but states that this mainly confirmed what he already knew and that it was not overly helpful for him.  - He tolerates fluids, including fruit juices, well except for pineapple juice (which causes sores in his mouth).  - He often drinks orange juice for potassium.  - Pt states that with his symptoms that his weight often fluctuates.  - He states that most recently, he has lost 30-40 lbs (9-12% body weight)  in the past 1 year.  - This weight loss is not significant for time frame.  - Will need to complete physical assessment on follow-up.   Diet Order:  Diet clear liquid Room service appropriate?: Yes; Fluid consistency:: Thin  Skin:  Reviewed, no issues  Last BM:  12/28  Height:   Ht Readings from Last 1 Encounters:  11/05/15  (1.778 m)    Weight:   Wt Readings from Last 1 Encounters:  11/05/15 290 lb (131.543 kg)    Ideal Body Weight:  75.45 kg (kg)  BMI:  Body  mass index is 41.61 kg/(m^2).  Estimated Nutritional Needs:   Kcal:  2100-2300  Protein:  115-125 grams of protein  Fluid:  2.3-2.6 L/day  EDUCATION NEEDS:   No education needs identified at this time     Trenton Gammon, RD, LDN Inpatient Clinical Dietitian Pager # [phone] After hours/weekend pager # [phone]

## 2015-11-08 NOTE — Progress Notes (Signed)
Patient discharged.  Educated on discharge instructions, medications, and follow-up appointments.  Verbalized understanding and signed AVS.  Patient gathered belongings.  IV removed.  No questions or concerns at this time.  No new prescriptions to give patient.

## 2015-11-08 NOTE — Discharge Instructions (Signed)

## 2016-03-27 ENCOUNTER — Emergency Department (HOSPITAL_COMMUNITY)
Admission: EM | Admit: 2016-03-27 | Discharge: 2016-03-28 | Disposition: A | Discharge status: Home / Self Care | Payer: BLUE CROSS/BLUE SHIELD | Attending: Emergency Medicine | Admitting: Emergency Medicine

## 2016-03-27 ENCOUNTER — Encounter (HOSPITAL_COMMUNITY): Payer: Self-pay

## 2016-03-27 DIAGNOSIS — R109 Unspecified abdominal pain: Secondary | ICD-10-CM | POA: Diagnosis not present

## 2016-03-27 DIAGNOSIS — F17210 Nicotine dependence, cigarettes, uncomplicated: Secondary | ICD-10-CM | POA: Diagnosis not present

## 2016-03-27 DIAGNOSIS — Z79899 Other long term (current) drug therapy: Secondary | ICD-10-CM | POA: Insufficient documentation

## 2016-03-27 DIAGNOSIS — Z79891 Long term (current) use of opiate analgesic: Secondary | ICD-10-CM | POA: Insufficient documentation

## 2016-03-27 LAB — URINALYSIS, ROUTINE W REFLEX MICROSCOPIC
BILIRUBIN URINE: NEGATIVE
Glucose, UA: NEGATIVE mg/dL
Hgb urine dipstick: NEGATIVE
KETONES UR: NEGATIVE mg/dL
Leukocytes, UA: NEGATIVE
NITRITE: NEGATIVE
PH: 6.5 (ref 5.0–8.0)
PROTEIN: NEGATIVE mg/dL
Specific Gravity, Urine: 1.02 (ref 1.005–1.030)

## 2016-03-27 MED ORDER — HYDROMORPHONE HCL 2 MG/ML IJ SOLN
2.0000 mg | Freq: Once | INTRAMUSCULAR | Status: AC
  Administered 2016-03-27: 2 mg via INTRAMUSCULAR
  Filled 2016-03-27: qty 1

## 2016-03-27 NOTE — ED Notes (Signed)
Pt called for triage, no answer

## 2016-03-27 NOTE — ED Notes (Signed)
Pt complains of left flank pain since last night, hx of kidney stones and pain is the same

## 2016-03-27 NOTE — ED Notes (Signed)
Pt states he has a ride home

## 2016-03-27 NOTE — ED Provider Notes (Signed)
CSN: [id number]     Arrival date & time 03/27/16  4098 History   First MD Initiated Contact with Patient 03/27/16 2243     Chief Complaint  Patient presents with  . Flank Pain     (Consider location/radiation/quality/duration/timing/severity/associated sxs/prior Treatment) HPI  30 year old male with history of chron's disease and prior kidney stones presents with recurrent kidney stone. States last night had left flank pain. Does not radiate. No hematuria or dysuria. Feels very similar to prior kidney stones, most recently 2 years ago. Follows with urology at [facility], feels like he can follow up with them tomorrow (which is what he's been told to do when he gets these symptoms). He denies fevers or vomiting. Chronic abdominal pain from chron's, not worse than typical. He would like to get a dose of pain medicine and tamsulosin until he can see his urologist tomorrow. He is on chronic oxycodone for pain.   Past Medical History  Diagnosis Date  . Crohn disease [facility])    Past Surgical History  Procedure Laterality Date  . Galbladder removed     History reviewed. No pertinent family history. Social History  Substance Use Topics  . Smoking status: Current Every Day Smoker -- 0.50 packs/day    Types: Cigarettes  . Smokeless tobacco: None  . Alcohol Use: No    Review of Systems  Constitutional: Negative for fever.  Gastrointestinal: Negative for vomiting and abdominal pain.  Genitourinary: Positive for flank pain. Negative for dysuria and hematuria.  All other systems reviewed and are negative.     Allergies  Lansoprazole  Home Medications   Prior to Admission medications   Medication Sig Start Date End Date Taking? Authorizing Provider  baclofen (LIORESAL) 10 MG tablet TK 1 T PO BID 03/10/16  Yes Historical Provider, MD  diphenhydrAMINE (BENADRYL) 25 MG tablet Take 50 mg by mouth at bedtime as needed for allergies.    Yes Historical Provider, MD  DULoxetine (CYMBALTA) 60 MG  capsule TK 1 C PO QD 10/22/15  Yes Historical Provider, MD  LYRICA 100 MG capsule TK 2 C PO DAILY 03/11/16  Yes Historical Provider, MD  NUCYNTA ER 150 MG TB12 TK 1 T PO Q 12 H 03/12/16  Yes Historical Provider, MD  oxyCODONE (ROXICODONE) 15 MG immediate release tablet TK 1 T PO q4h prn pain 10/22/15  Yes Historical Provider, MD  promethazine (PHENERGAN) 25 MG tablet Take 25 mg by mouth every 8 (eight) hours as needed for nausea or vomiting.  04/04/15  Yes Historical Provider, MD  Adalimumab (HUMIRA PEN) 40 MG/0.8ML PNKT Inject 80 mg into the skin every 14 (fourteen) days.  08/22/15   Historical Provider, MD   BP 145/98 mmHg  Pulse 108  Temp(Src) 98.6 F (37 C) (Oral)  Resp 18  SpO2 100% Physical Exam  Constitutional: He is oriented to person, place, and time. He appears well-developed and well-nourished. No distress.  HENT:  Head: Normocephalic and atraumatic.  Right Ear: External ear normal.  Left Ear: External ear normal.  Nose: Nose normal.  Eyes: Right eye exhibits no discharge. Left eye exhibits no discharge.  Neck: Neck supple.  Cardiovascular: Normal rate, regular rhythm, normal heart sounds and intact distal pulses.   Pulmonary/Chest: Effort normal and breath sounds normal.  Abdominal: Soft. He exhibits no distension. There is no tenderness. There is CVA tenderness (left sided).  Musculoskeletal: He exhibits no edema.  Neurological: He is alert and oriented to person, place, and time.  Skin: Skin is  warm and dry. He is not diaphoretic.  Nursing note and vitals reviewed.   ED Course  Procedures (including critical care time) Labs Review Labs Reviewed  URINALYSIS, ROUTINE W REFLEX MICROSCOPIC (NOT AT [facility])    Imaging Review No results found. I have personally reviewed and evaluated these images and lab results as part of my medical decision-making.   EKG Interpretation None      MDM   Final diagnoses:  Left flank pain    Patient is resting comfortably requesting  dilaudid and rx for tamsulosin. I offered workup in ED including labs and CT vs u/s. Patient declines and states he just needs better pain control and will f/u with urology tomorrow. My suspicion for significant obstructing stone that would need intervention tonight is low. Does not appear ill or septic. UA negative. Will treat with IM pain meds and rx tamsulosin. He has someone coming to drive him home. I doubt other acute emergent abd pathology and feel he is stable for discharge.     Pricilla Loveless, MD 03/28/16 [phone]

## 2016-03-28 MED ORDER — HYDROMORPHONE HCL 2 MG/ML IJ SOLN
2.0000 mg | Freq: Once | INTRAMUSCULAR | Status: AC
  Administered 2016-03-28: 2 mg via INTRAMUSCULAR
  Filled 2016-03-28: qty 1

## 2016-03-28 MED ORDER — TAMSULOSIN HCL 0.4 MG PO CAPS: 0.4000 mg | ORAL_CAPSULE | Freq: Every day | ORAL | Status: AC

## 2016-06-26 ENCOUNTER — Emergency Department (HOSPITAL_COMMUNITY)
Admission: EM | Admit: 2016-06-26 | Discharge: 2016-06-27 | Disposition: A | Discharge status: Home / Self Care | Payer: BLUE CROSS/BLUE SHIELD | Attending: Emergency Medicine | Admitting: Emergency Medicine

## 2016-06-26 ENCOUNTER — Encounter (HOSPITAL_COMMUNITY): Payer: Self-pay | Admitting: Emergency Medicine

## 2016-06-26 ENCOUNTER — Emergency Department (HOSPITAL_COMMUNITY): Payer: BLUE CROSS/BLUE SHIELD

## 2016-06-26 DIAGNOSIS — R0789 Other chest pain: Secondary | ICD-10-CM | POA: Insufficient documentation

## 2016-06-26 DIAGNOSIS — R61 Generalized hyperhidrosis: Secondary | ICD-10-CM | POA: Insufficient documentation

## 2016-06-26 DIAGNOSIS — G8929 Other chronic pain: Secondary | ICD-10-CM | POA: Diagnosis not present

## 2016-06-26 DIAGNOSIS — R600 Localized edema: Secondary | ICD-10-CM | POA: Diagnosis not present

## 2016-06-26 DIAGNOSIS — Z79899 Other long term (current) drug therapy: Secondary | ICD-10-CM | POA: Insufficient documentation

## 2016-06-26 DIAGNOSIS — R0602 Shortness of breath: Secondary | ICD-10-CM | POA: Diagnosis not present

## 2016-06-26 DIAGNOSIS — F17210 Nicotine dependence, cigarettes, uncomplicated: Secondary | ICD-10-CM | POA: Insufficient documentation

## 2016-06-26 DIAGNOSIS — R079 Chest pain, unspecified: Secondary | ICD-10-CM

## 2016-06-26 DIAGNOSIS — R112 Nausea with vomiting, unspecified: Secondary | ICD-10-CM | POA: Insufficient documentation

## 2016-06-26 LAB — CBC
HEMATOCRIT: 44.9 % (ref 39.0–52.0)
Hemoglobin: 15.1 g/dL (ref 13.0–17.0)
MCH: 28.9 pg (ref 26.0–34.0)
MCHC: 33.6 g/dL (ref 30.0–36.0)
MCV: 85.9 fL (ref 78.0–100.0)
PLATELETS: 361 10*3/uL (ref 150–400)
RBC: 5.23 MIL/uL (ref 4.22–5.81)
RDW: 14.4 % (ref 11.5–15.5)
WBC: 16 10*3/uL — ABNORMAL HIGH (ref 4.0–10.5)

## 2016-06-26 LAB — BASIC METABOLIC PANEL
Anion gap: 8 (ref 5–15)
BUN: 16 mg/dL (ref 6–20)
CHLORIDE: 106 mmol/L (ref 101–111)
CO2: 24 mmol/L (ref 22–32)
CREATININE: 0.81 mg/dL (ref 0.61–1.24)
Calcium: 9.3 mg/dL (ref 8.9–10.3)
GFR calc Af Amer: >60 mL/min (ref >60)
GFR calc non Af Amer: >60 mL/min (ref >60)
Glucose, Bld: 124 mg/dL — ABNORMAL HIGH (ref 65–99)
POTASSIUM: 4.1 mmol/L (ref 3.5–5.1)
Sodium: 138 mmol/L (ref 135–145)

## 2016-06-26 LAB — I-STAT TROPONIN, ED: Troponin i, poc: 0 ng/mL (ref 0.00–0.08)
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
## 2016-06-26 NOTE — ED Triage Notes (Signed)
Pt is c/o chest pain that started a few hours ago  Pt states the pain is in the middle of his chest and radiates down  Pt states the pain started when he was walking

## 2016-06-27 LAB — D-DIMER, QUANTITATIVE: D-Dimer, Quant: <0.27 ug/mL-FEU (ref 0.00–0.50)

## 2016-06-27 LAB — TROPONIN I: Troponin I: <0.03 ng/mL (ref <0.03)

## 2016-06-27 LAB — SEDIMENTATION RATE: Sed Rate: 30 mm/hr — ABNORMAL HIGH (ref 0–16)

## 2016-06-27 MED ORDER — ASPIRIN 81 MG PO CHEW
324.0000 mg | CHEWABLE_TABLET | Freq: Once | ORAL | Status: DC
  Filled 2016-06-27: qty 4

## 2016-06-27 MED ORDER — PROMETHAZINE HCL 25 MG/ML IJ SOLN
25.0000 mg | Freq: Once | INTRAMUSCULAR | Status: AC
  Administered 2016-06-27: 25 mg via INTRAVENOUS
  Filled 2016-06-27: qty 1

## 2016-06-27 MED ORDER — DEXAMETHASONE SODIUM PHOSPHATE 10 MG/ML IJ SOLN
10.0000 mg | Freq: Once | INTRAMUSCULAR | Status: AC
  Administered 2016-06-27: 10 mg via INTRAVENOUS
  Filled 2016-06-27: qty 1

## 2016-06-27 MED ORDER — KETOROLAC TROMETHAMINE 30 MG/ML IJ SOLN
30.0000 mg | Freq: Once | INTRAMUSCULAR | Status: AC
  Administered 2016-06-27: 30 mg via INTRAVENOUS
  Filled 2016-06-27: qty 1

## 2016-06-27 MED ORDER — HYDROMORPHONE HCL 1 MG/ML IJ SOLN
1.0000 mg | Freq: Once | INTRAMUSCULAR | Status: AC
  Administered 2016-06-27: 1 mg via INTRAVENOUS

## 2016-06-27 MED ORDER — HYDROMORPHONE HCL 1 MG/ML IJ SOLN
1.0000 mg | Freq: Once | INTRAMUSCULAR | Status: AC
  Administered 2016-06-27: 1 mg via INTRAVENOUS
  Filled 2016-06-27: qty 1

## 2016-06-27 MED ORDER — ONDANSETRON HCL 4 MG/2ML IJ SOLN
4.0000 mg | Freq: Once | INTRAMUSCULAR | Status: AC
  Administered 2016-06-27: 4 mg via INTRAVENOUS
  Filled 2016-06-27: qty 2

## 2016-06-27 MED ORDER — NITROGLYCERIN 0.4 MG SL SUBL
0.4000 mg | SUBLINGUAL_TABLET | SUBLINGUAL | Status: DC | PRN
  Administered 2016-06-27 (×2): 0.4 mg via SUBLINGUAL
  Filled 2016-06-27: qty 1

## 2016-06-27 MED ORDER — MORPHINE SULFATE (PF) 4 MG/ML IV SOLN: 4.0000 mg | Freq: Once | INTRAVENOUS | Status: DC

## 2016-06-27 MED ORDER — MORPHINE SULFATE (PF) 4 MG/ML IV SOLN
4.0000 mg | Freq: Once | INTRAVENOUS | Status: AC
  Administered 2016-06-27: 4 mg via INTRAVENOUS
  Filled 2016-06-27: qty 1

## 2016-06-27 MED ORDER — HYDROMORPHONE HCL 1 MG/ML IJ SOLN
1.0000 mg | Freq: Once | INTRAMUSCULAR | Status: DC
  Filled 2016-06-27 (×2): qty 1

## 2016-06-27 NOTE — ED Notes (Signed)
Pt ambulated to bathroom 

## 2016-06-27 NOTE — Discharge Instructions (Signed)
Return if you start running a fever, develop difficulty breathing, or if pain is getting worse.

## 2016-06-27 NOTE — ED Notes (Addendum)
Patient requesting phenergan.  States "I think its too soon to have another dose of zofran".  Patient also requesting pain medication.  Patient states "i think they just need to admit me until I can have an egd done".  MD made aware.  Patient in NAD, sitting up in bed, speaking with this RN.

## 2016-06-28 ENCOUNTER — Emergency Department (HOSPITAL_COMMUNITY): Payer: BLUE CROSS/BLUE SHIELD

## 2016-06-28 ENCOUNTER — Emergency Department (HOSPITAL_COMMUNITY)
Admission: EM | Admit: 2016-06-28 | Discharge: 2016-06-28 | Disposition: A | Discharge status: Home / Self Care | Payer: BLUE CROSS/BLUE SHIELD | Attending: Emergency Medicine | Admitting: Emergency Medicine

## 2016-06-28 ENCOUNTER — Encounter (HOSPITAL_COMMUNITY): Payer: Self-pay | Admitting: *Deleted

## 2016-06-28 DIAGNOSIS — F17210 Nicotine dependence, cigarettes, uncomplicated: Secondary | ICD-10-CM | POA: Diagnosis not present

## 2016-06-28 DIAGNOSIS — Z7951 Long term (current) use of inhaled steroids: Secondary | ICD-10-CM | POA: Diagnosis not present

## 2016-06-28 DIAGNOSIS — R0789 Other chest pain: Secondary | ICD-10-CM | POA: Insufficient documentation

## 2016-06-28 DIAGNOSIS — Z79899 Other long term (current) drug therapy: Secondary | ICD-10-CM | POA: Diagnosis not present

## 2016-06-28 HISTORY — DX: Calculus of kidney: N20.0

## 2016-06-28 LAB — CBC WITH DIFFERENTIAL/PLATELET
BASOS ABS: 0 10*3/uL (ref 0.0–0.1)
Basophils Relative: 0 %
EOS PCT: 1 %
Eosinophils Absolute: 0.1 10*3/uL (ref 0.0–0.7)
HCT: 44.2 % (ref 39.0–52.0)
Hemoglobin: 14.5 g/dL (ref 13.0–17.0)
LYMPHS PCT: 19 %
Lymphs Abs: 3.9 10*3/uL (ref 0.7–4.0)
MCH: 28.2 pg (ref 26.0–34.0)
MCHC: 32.8 g/dL (ref 30.0–36.0)
MCV: 85.8 fL (ref 78.0–100.0)
Monocytes Absolute: 1.4 10*3/uL — ABNORMAL HIGH (ref 0.1–1.0)
Monocytes Relative: 7 %
Neutro Abs: 15.2 10*3/uL — ABNORMAL HIGH (ref 1.7–7.7)
Neutrophils Relative %: 73 %
PLATELETS: 367 10*3/uL (ref 150–400)
RBC: 5.15 MIL/uL (ref 4.22–5.81)
RDW: 14.4 % (ref 11.5–15.5)
WBC: 20.6 10*3/uL — AB (ref 4.0–10.5)

## 2016-06-28 LAB — COMPREHENSIVE METABOLIC PANEL
ALT: 21 U/L (ref 17–63)
AST: 15 U/L (ref 15–41)
Albumin: 3.6 g/dL (ref 3.5–5.0)
Alkaline Phosphatase: 69 U/L (ref 38–126)
Anion gap: 10 (ref 5–15)
BUN: 15 mg/dL (ref 6–20)
CHLORIDE: 106 mmol/L (ref 101–111)
CO2: 24 mmol/L (ref 22–32)
CREATININE: 0.79 mg/dL (ref 0.61–1.24)
Calcium: 9.3 mg/dL (ref 8.9–10.3)
GFR calc Af Amer: >60 mL/min (ref >60)
GFR calc non Af Amer: >60 mL/min (ref >60)
GLUCOSE: 95 mg/dL (ref 65–99)
Potassium: 4 mmol/L (ref 3.5–5.1)
SODIUM: 140 mmol/L (ref 135–145)
Total Bilirubin: 0.3 mg/dL (ref 0.3–1.2)
Total Protein: 6.9 g/dL (ref 6.5–8.1)

## 2016-06-28 LAB — TROPONIN I
Troponin I: <0.03 ng/mL (ref <0.03)
Troponin I: <0.03 ng/mL (ref <0.03)

## 2016-06-28 LAB — D-DIMER, QUANTITATIVE: D-Dimer, Quant: <0.27 ug/mL-FEU (ref 0.00–0.50)

## 2016-06-28 LAB — ACETAMINOPHEN LEVEL: Acetaminophen (Tylenol), Serum: <10 ug/mL — ABNORMAL LOW (ref 10–30)

## 2016-06-28 MED ORDER — HYDROMORPHONE HCL 1 MG/ML IJ SOLN
1.0000 mg | Freq: Once | INTRAMUSCULAR | Status: AC
  Administered 2016-06-28: 1 mg via INTRAVENOUS
  Filled 2016-06-28: qty 1

## 2016-06-28 MED ORDER — OXYCODONE HCL 5 MG PO TABS
10.0000 mg | ORAL_TABLET | Freq: Once | ORAL | Status: AC
  Administered 2016-06-28: 10 mg via ORAL
  Filled 2016-06-28: qty 2

## 2016-06-28 MED ORDER — OXYCODONE-ACETAMINOPHEN 5-325 MG PO TABS
2.0000 | ORAL_TABLET | Freq: Once | ORAL | Status: DC
Start: 2016-06-28 — End: 2016-06-28
  Filled 2016-06-28 (×2): qty 2

## 2016-06-28 NOTE — Discharge Instructions (Signed)
There is no evidence of heart attack or blood clot in the lung. Followup with your pain management doctor. Do not take more than 4 grams of tylenol per day. Return to the ED if you develop fever, shortness of breath or worsening pain.

## 2016-06-28 NOTE — ED Triage Notes (Addendum)
Patient presents with substernal chest pain radiating to the back since around 8 am this morning.  Patient had similar pain Thursday night (8/17) and was evaluated in ED and sent home.  Patient states his chest hurts with deep inspiration and expiration.  Patient states he "always" has a cough because he is a smoker.  Patient endorses diaphoresis last night and N/V, but states he has a history of Crohn's and "I'm always nauseated."  Patient also has hx of GERD for which he intermittently takes protonix.
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
## 2016-06-28 NOTE — ED Triage Notes (Signed)
Pt continues to refuse to leave, writer in to speak with pt, he had questions for the EDP which were addressed.Security notified for escort out of facility, pt reluctant but leaves facility on his own accord, ambulatory.

## 2016-06-28 NOTE — ED Notes (Signed)
Patty, our C.N. And Dr. Eudelia Bunch have spoken with pt. At length to wit: he is cleared to go home and no further pain med(s) will be forthcoming.  I have just attempted to d/c pt. And he states "Tell the doctor I need to see him again".  I notify Dr. Eudelia Bunch; and he tells me that pt. Is d/c'd. And to employ the assistance of security if necessary.  I notify Patty, our C.N. Who is seeing to this situation as I write this.  Pt. Remains in no distress.

## 2016-06-28 NOTE — ED Notes (Signed)
I have informed Dr. Manus Gunning that pt. Is unhappy, and that he wants more pain meds.  A male relative is with him and states "We've been calling for a long time and he doesn't feel any better".  According to Dr. Manus Gunning, we will be discharging him home shortly.  I have informed pt. And his relative that Dr. Manus Gunning will speak with them as soon as he is able.

## 2016-06-28 NOTE — ED Triage Notes (Signed)
Pt and mother wanting pt admitted due to pain, writer spent time explaining pt has been evaluated and test have been completed, The EDP has not determined a need for admission. Pt crying and c/o pain in chest and abd that is chronic and point tender to touch. Dr Eudelia Bunch reevaluated pts results and notes from Dr Manus Gunning and feels everything has been addressed, pt and mother requesting to speak with EDP, writer and EDP at bedside to speak with family, reviewed care with both, offered Tylenol, pt has already exceeded the max dose for the day, can not take NSAIDs, offered Lidocaine patches, "won;t work", suggested hot and cold compresses, "don;t work", suggested massage, "Can't stand to be touched" Again encouraged to try these measures again and follow up with pain clinic on Monday. Pt and mother not happy, continue with discharge as planned.

## 2017-04-10 DEATH — deceased

## 2017-11-17 IMAGING — CR DG CHEST 2V
2 series · 2 of 2 positions shown · non-contrast
Comparison: 06/26/2016

CLINICAL DATA: Left upper CP today; pt states that he was in ER 2-3
days ago with same symptom; no known cardiopul;monary problems
smoker

EXAM:
CHEST  2 VIEW

[w chest pa]
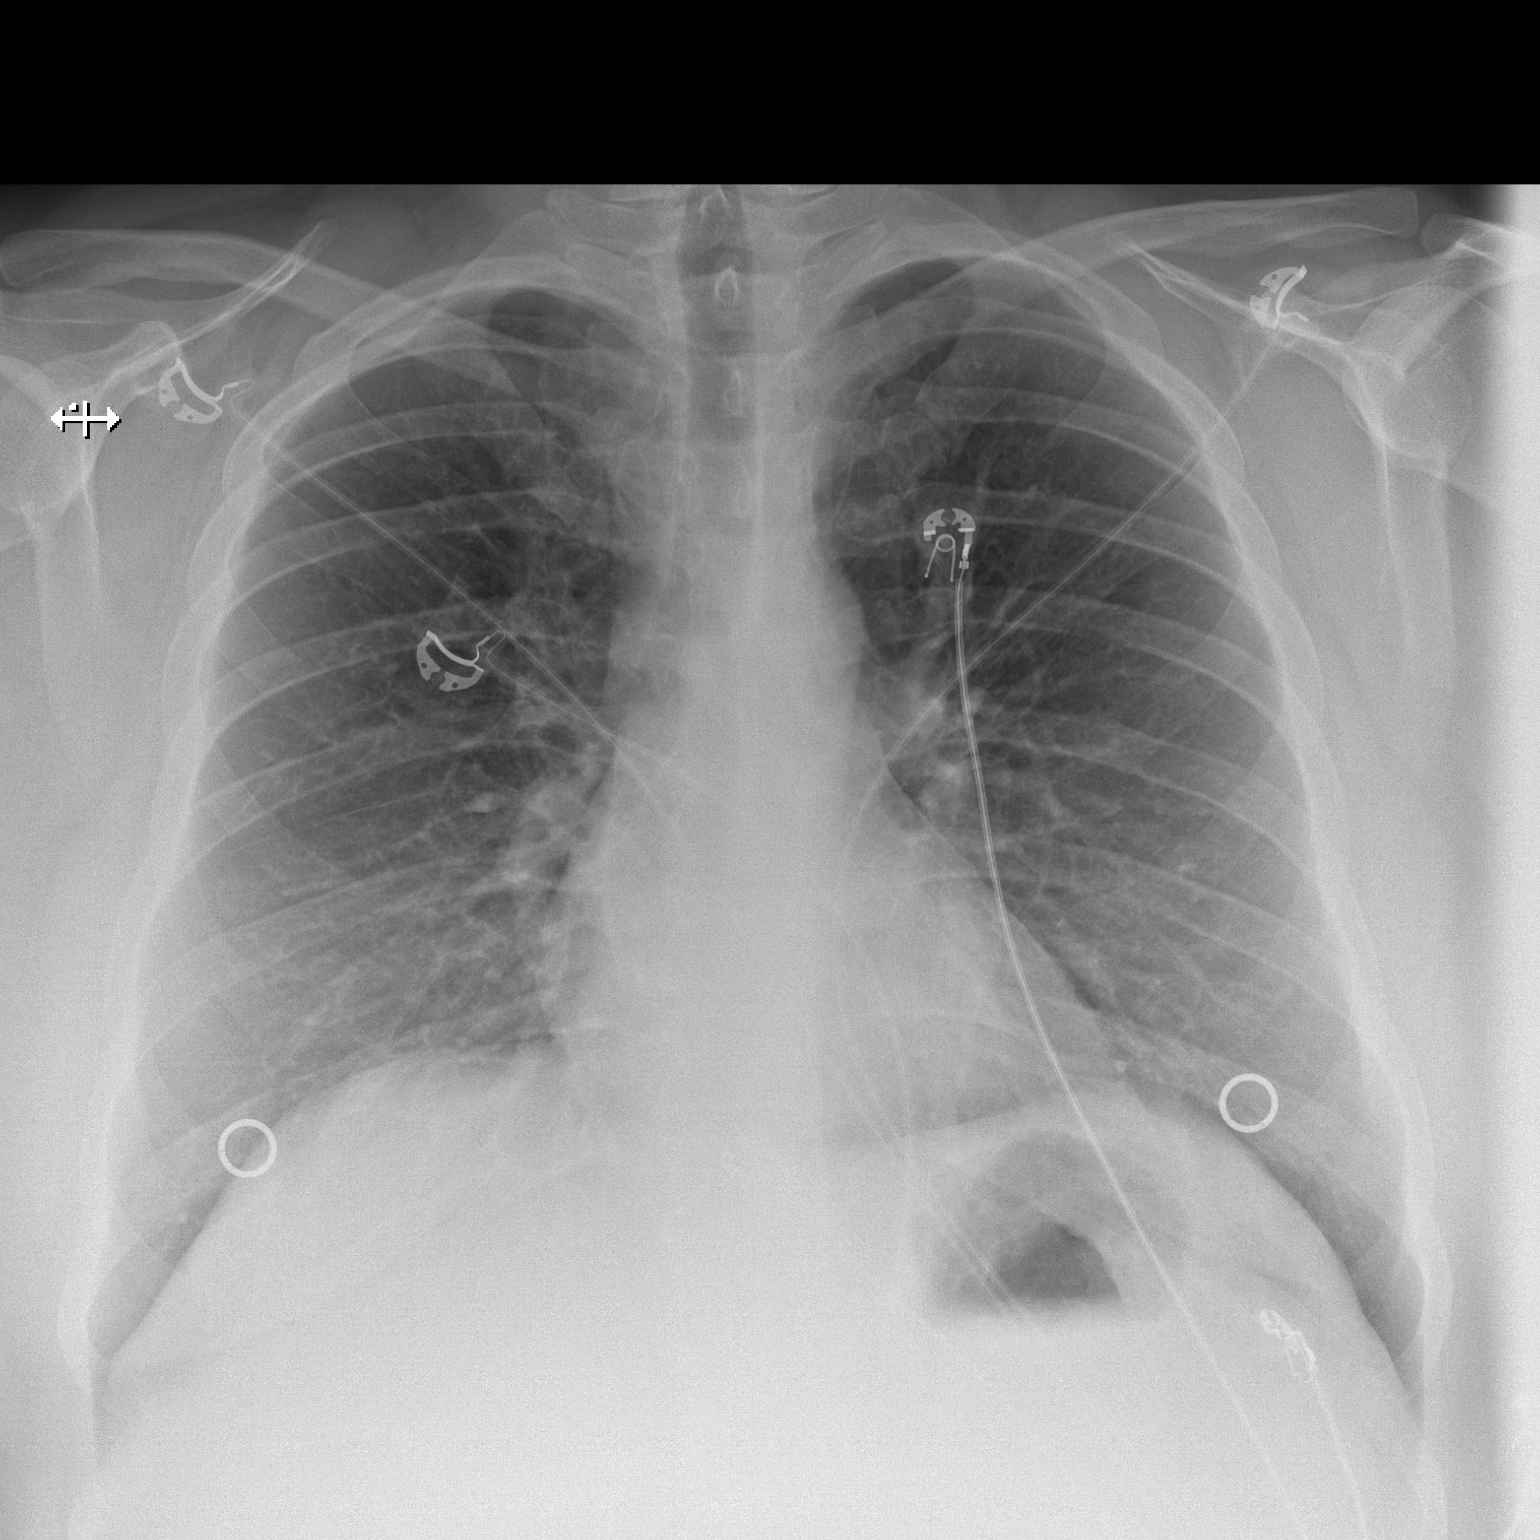

[w chest lat]
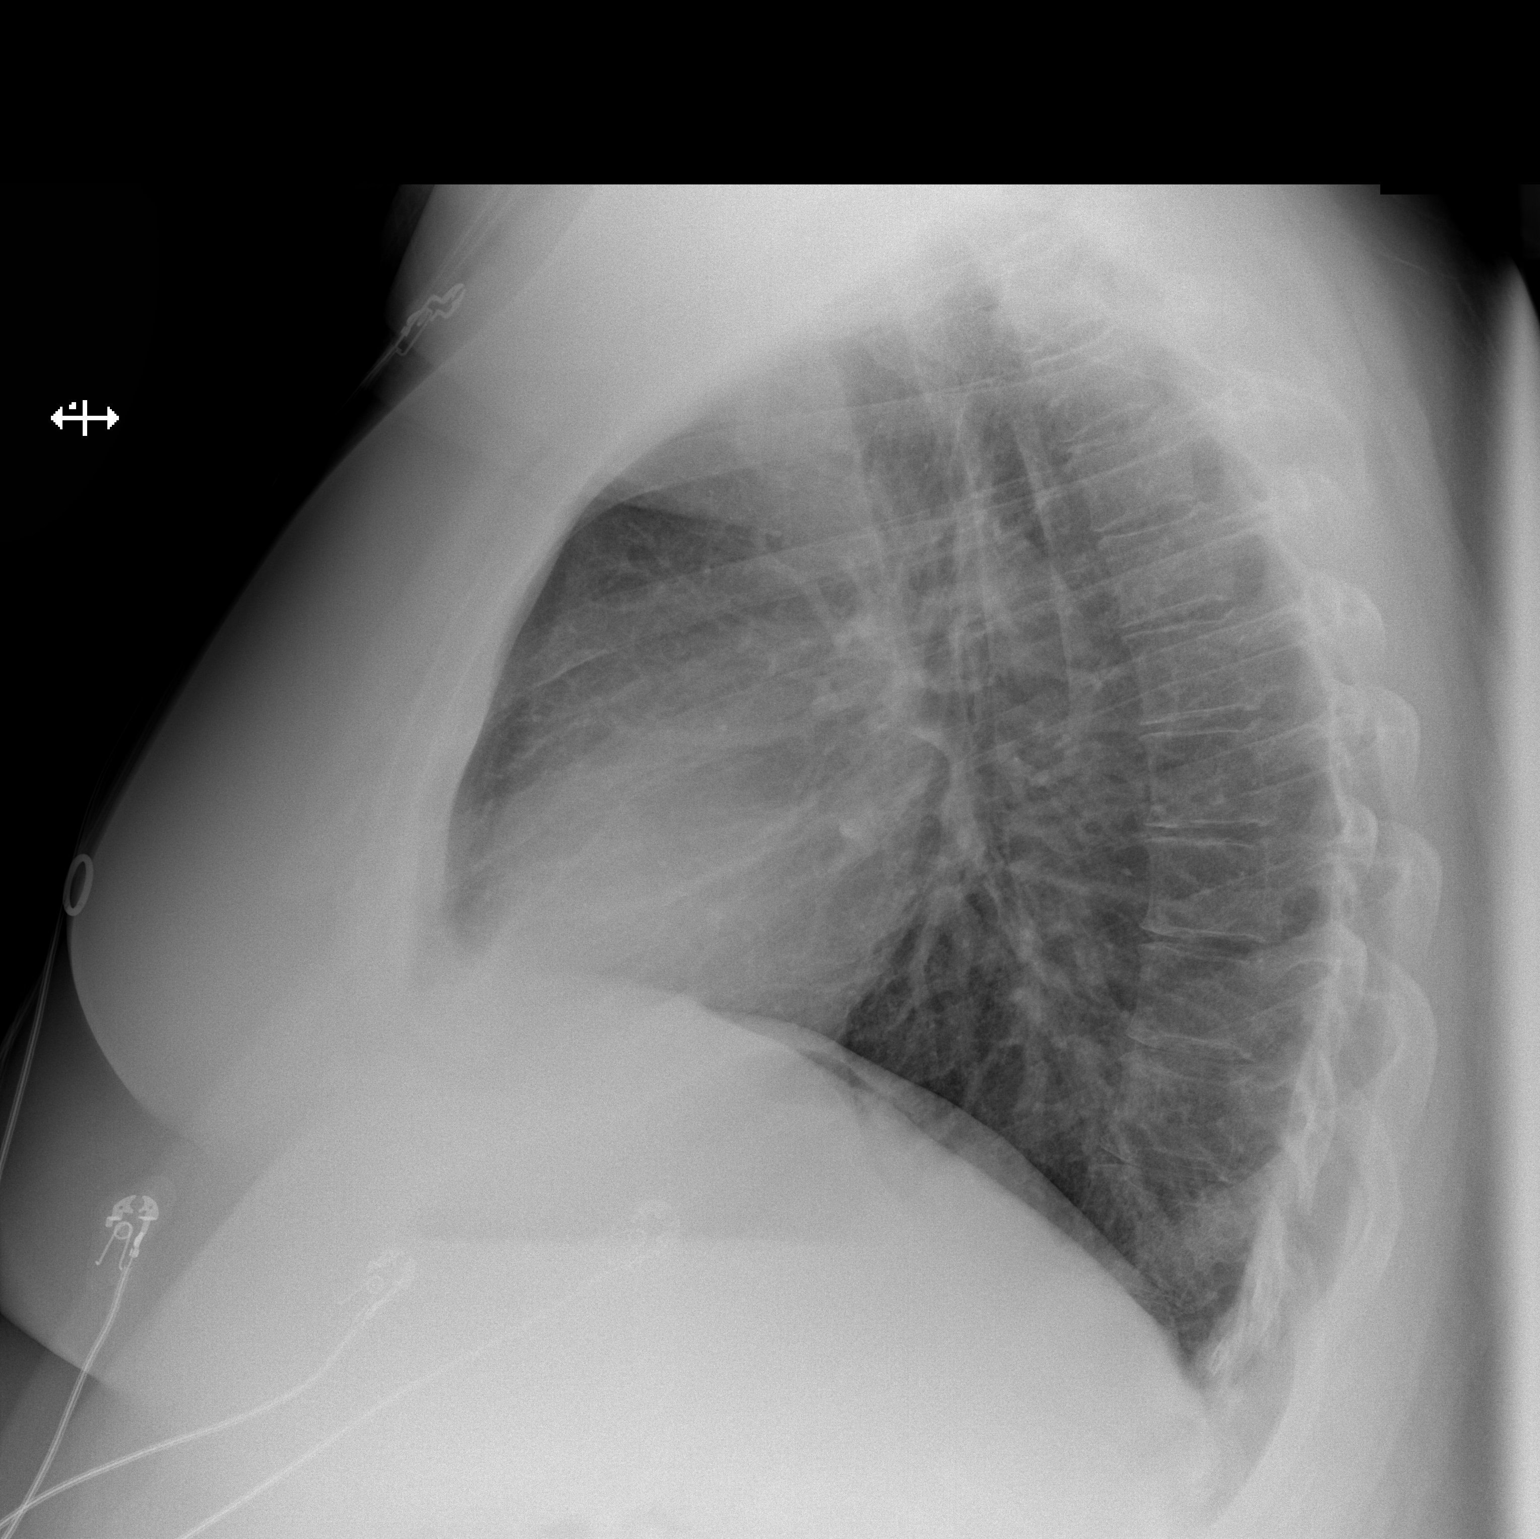

[2 of 2 positions shown; findings below may reference images not displayed]

FINDINGS: Midline trachea.  Normal heart size and mediastinal contours.

Sharp costophrenic angles.  No pneumothorax.  Clear lungs.
IMPRESSION: No active cardiopulmonary disease.
